# Patient Record
Sex: Female | Born: 1940 | Race: White | Hispanic: No
Health system: Southern US, Community
[De-identification: ages and names within clinical notes are randomized; demographics above are authoritative.]

## PROBLEM LIST (undated history)

## (undated) DIAGNOSIS — Z9911 Dependence on respirator [ventilator] status: Secondary | ICD-10-CM

## (undated) DIAGNOSIS — J9621 Acute and chronic respiratory failure with hypoxia: Secondary | ICD-10-CM

## (undated) DIAGNOSIS — J984 Other disorders of lung: Secondary | ICD-10-CM

## (undated) DIAGNOSIS — J189 Pneumonia, unspecified organism: Secondary | ICD-10-CM

## (undated) DIAGNOSIS — R6520 Severe sepsis without septic shock: Secondary | ICD-10-CM

## (undated) DIAGNOSIS — A419 Sepsis, unspecified organism: Secondary | ICD-10-CM

## (undated) DIAGNOSIS — J181 Lobar pneumonia, unspecified organism: Secondary | ICD-10-CM

## (undated) DIAGNOSIS — I2699 Other pulmonary embolism without acute cor pulmonale: Secondary | ICD-10-CM

---

## 2018-09-03 ENCOUNTER — Inpatient Hospital Stay
Admission: AD | Admit: 2018-09-03 | Discharge: 2018-10-18 | Disposition: A | Discharge status: Skilled Nursing Facility | Payer: Medicare Other | Source: Other Acute Inpatient Hospital | Attending: Internal Medicine | Admitting: Internal Medicine

## 2018-09-03 ENCOUNTER — Other Ambulatory Visit (HOSPITAL_COMMUNITY): Payer: Medicare Other

## 2018-09-03 DIAGNOSIS — Z9911 Dependence on respirator [ventilator] status: Secondary | ICD-10-CM

## 2018-09-03 DIAGNOSIS — J9621 Acute and chronic respiratory failure with hypoxia: Secondary | ICD-10-CM | POA: Diagnosis present

## 2018-09-03 DIAGNOSIS — Z0189 Encounter for other specified special examinations: Secondary | ICD-10-CM

## 2018-09-03 DIAGNOSIS — R1310 Dysphagia, unspecified: Secondary | ICD-10-CM

## 2018-09-03 DIAGNOSIS — I2699 Other pulmonary embolism without acute cor pulmonale: Secondary | ICD-10-CM | POA: Diagnosis present

## 2018-09-03 DIAGNOSIS — Z01818 Encounter for other preprocedural examination: Secondary | ICD-10-CM

## 2018-09-03 DIAGNOSIS — J181 Lobar pneumonia, unspecified organism: Secondary | ICD-10-CM | POA: Diagnosis present

## 2018-09-03 DIAGNOSIS — Z9289 Personal history of other medical treatment: Secondary | ICD-10-CM

## 2018-09-03 DIAGNOSIS — J189 Pneumonia, unspecified organism: Secondary | ICD-10-CM

## 2018-09-03 DIAGNOSIS — Z931 Gastrostomy status: Secondary | ICD-10-CM

## 2018-09-03 DIAGNOSIS — J984 Other disorders of lung: Secondary | ICD-10-CM

## 2018-09-03 DIAGNOSIS — Z95828 Presence of other vascular implants and grafts: Secondary | ICD-10-CM

## 2018-09-03 DIAGNOSIS — A419 Sepsis, unspecified organism: Secondary | ICD-10-CM | POA: Diagnosis present

## 2018-09-03 HISTORY — DX: Lobar pneumonia, unspecified organism: J18.1

## 2018-09-03 HISTORY — DX: Pneumonia, unspecified organism: J18.9

## 2018-09-03 HISTORY — DX: Other disorders of lung: J98.4

## 2018-09-03 HISTORY — DX: Severe sepsis without septic shock: R65.20

## 2018-09-03 HISTORY — DX: Acute and chronic respiratory failure with hypoxia: J96.21

## 2018-09-03 HISTORY — DX: Other pulmonary embolism without acute cor pulmonale: I26.99

## 2018-09-03 HISTORY — DX: Sepsis, unspecified organism: A41.9

## 2018-09-03 HISTORY — DX: Dependence on respirator (ventilator) status: Z99.11

## 2018-09-03 LAB — CBC WITH DIFFERENTIAL/PLATELET
Abs Immature Granulocytes: 0.07 10*3/uL (ref 0.00–0.07)
Basophils Absolute: 0 10*3/uL (ref 0.0–0.1)
Basophils Relative: 0 %
Eosinophils Absolute: 0 10*3/uL (ref 0.0–0.5)
Eosinophils Relative: 0 %
HCT: 28 % — ABNORMAL LOW (ref 36.0–46.0)
Hemoglobin: 9.2 g/dL — ABNORMAL LOW (ref 12.0–15.0)
Immature Granulocytes: 0 %
Lymphocytes Relative: 2 %
Lymphs Abs: 0.4 10*3/uL — ABNORMAL LOW (ref 0.7–4.0)
MCH: 29.4 pg (ref 26.0–34.0)
MCHC: 32.9 g/dL (ref 30.0–36.0)
MCV: 89.5 fL (ref 80.0–100.0)
Monocytes Absolute: 0.3 10*3/uL (ref 0.1–1.0)
Monocytes Relative: 2 %
Neutro Abs: 14.8 10*3/uL — ABNORMAL HIGH (ref 1.7–7.7)
Neutrophils Relative %: 96 %
Platelets: 291 10*3/uL (ref 150–400)
RBC: 3.13 MIL/uL — ABNORMAL LOW (ref 3.87–5.11)
RDW: 15.7 % — ABNORMAL HIGH (ref 11.5–15.5)
WBC: 15.6 10*3/uL — ABNORMAL HIGH (ref 4.0–10.5)
nRBC: 0 % (ref 0.0–0.2)

## 2018-09-03 LAB — BLOOD GAS, ARTERIAL
Acid-Base Excess: 0.1 mmol/L (ref 0.0–2.0)
Bicarbonate: 23.2 mmol/L (ref 20.0–28.0)
FIO2: 40
MECHVT: 450 mL
O2 Saturation: 99 %
PEEP: 8 cmH2O
Patient temperature: 98.2
RATE: 12 resp/min
pCO2 arterial: 30.9 mmHg — ABNORMAL LOW (ref 32.0–48.0)
pH, Arterial: 7.486 — ABNORMAL HIGH (ref 7.350–7.450)
pO2, Arterial: 173 mmHg — ABNORMAL HIGH (ref 83.0–108.0)

## 2018-09-03 LAB — COMPREHENSIVE METABOLIC PANEL
ALT: 37 U/L (ref 0–44)
AST: 26 U/L (ref 15–41)
Albumin: 2.6 g/dL — ABNORMAL LOW (ref 3.5–5.0)
Alkaline Phosphatase: 123 U/L (ref 38–126)
Anion gap: 13 (ref 5–15)
BUN: 21 mg/dL (ref 8–23)
CO2: 23 mmol/L (ref 22–32)
Calcium: 8.4 mg/dL — ABNORMAL LOW (ref 8.9–10.3)
Chloride: 99 mmol/L (ref 98–111)
Creatinine, Ser: 0.87 mg/dL (ref 0.44–1.00)
GFR calc Af Amer: >60 mL/min (ref >60)
GFR calc non Af Amer: >60 mL/min (ref >60)
Glucose, Bld: 140 mg/dL — ABNORMAL HIGH (ref 70–99)
Potassium: 4.3 mmol/L (ref 3.5–5.1)
Sodium: 135 mmol/L (ref 135–145)
Total Bilirubin: 0.9 mg/dL (ref 0.3–1.2)
Total Protein: 6.5 g/dL (ref 6.5–8.1)

## 2018-09-03 LAB — HEPARIN LEVEL (UNFRACTIONATED): Heparin Unfractionated: <0.1 IU/mL — ABNORMAL LOW (ref 0.30–0.70)

## 2018-09-03 LAB — PROTIME-INR
INR: 2.7 — ABNORMAL HIGH (ref 0.8–1.2)
Prothrombin Time: 28.6 seconds — ABNORMAL HIGH (ref 11.4–15.2)

## 2018-09-03 MED ORDER — PROSOURCE TF PO LIQD
45.00 | ORAL | Status: DC
Start: 2018-09-03 — End: 2018-09-03

## 2018-09-03 MED ORDER — GENERIC EXTERNAL MEDICATION
Status: DC
Start: ? — End: 2018-09-03

## 2018-09-03 MED ORDER — FAMOTIDINE 20 MG PO TABS
20.00 | ORAL_TABLET | ORAL | Status: DC
Start: 2018-09-03 — End: 2018-09-03

## 2018-09-03 MED ORDER — GENERIC EXTERNAL MEDICATION
1.00 | Status: DC
Start: ? — End: 2018-09-03

## 2018-09-03 MED ORDER — HEPARIN SOD (PORCINE) IN D5W 100 UNIT/ML IV SOLN
18.00 | INTRAVENOUS | Status: DC
Start: ? — End: 2018-09-03

## 2018-09-03 MED ORDER — NALOXONE HCL 0.4 MG/ML IJ SOLN
0.04 | INTRAMUSCULAR | Status: DC
Start: ? — End: 2018-09-03

## 2018-09-03 MED ORDER — CARBOXYMETHYLCELLULOSE SOD PF 0.5 % OP SOLN
2.00 | OPHTHALMIC | Status: DC
Start: 2018-09-03 — End: 2018-09-03

## 2018-09-03 MED ORDER — CLINDAMYCIN PHOSPHATE IN D5W 600 MG/50ML IV SOLN
600.00 | INTRAVENOUS | Status: DC
Start: 2018-09-03 — End: 2018-09-03

## 2018-09-03 MED ORDER — FUROSEMIDE 10 MG/ML IJ SOLN
20.00 | INTRAMUSCULAR | Status: DC
Start: ? — End: 2018-09-03

## 2018-09-03 MED ORDER — INSULIN ASPART 100 UNIT/ML FLEXPEN
0.00 | PEN_INJECTOR | SUBCUTANEOUS | Status: DC
Start: 2018-09-03 — End: 2018-09-03

## 2018-09-03 MED ORDER — SODIUM CHLORIDE 0.9 % IV SOLN
INTRAVENOUS | Status: DC
Start: ? — End: 2018-09-03

## 2018-09-03 MED ORDER — ACETAMINOPHEN 325 MG PO TABS
650.00 | ORAL_TABLET | ORAL | Status: DC
Start: ? — End: 2018-09-03

## 2018-09-03 MED ORDER — GENERIC EXTERNAL MEDICATION
5.00 | Status: DC
Start: ? — End: 2018-09-03

## 2018-09-03 MED ORDER — LACTATED RINGERS IV SOLN
500.00 | INTRAVENOUS | Status: DC
Start: ? — End: 2018-09-03

## 2018-09-03 MED ORDER — MAGNESIUM SULFATE 4 GM/100ML IV SOLN
4.00 | INTRAVENOUS | Status: DC
Start: ? — End: 2018-09-03

## 2018-09-03 MED ORDER — METOCLOPRAMIDE HCL 5 MG/ML IJ SOLN
5.00 | INTRAMUSCULAR | Status: DC
Start: 2018-09-03 — End: 2018-09-03

## 2018-09-03 MED ORDER — ATROPINE SULFATE 0.5 MG/5ML IJ SOSY
.50 | PREFILLED_SYRINGE | INTRAMUSCULAR | Status: DC
Start: ? — End: 2018-09-03

## 2018-09-03 MED ORDER — NOREPINEPHRINE 4 MG/250ML-% IV SOLN
0.02 | INTRAVENOUS | Status: DC
Start: ? — End: 2018-09-03

## 2018-09-03 MED ORDER — PREDNISONE 20 MG PO TABS
20.00 | ORAL_TABLET | ORAL | Status: DC
Start: 2018-09-04 — End: 2018-09-03

## 2018-09-03 MED ORDER — GENERIC EXTERNAL MEDICATION
4000.00 | Status: DC
Start: ? — End: 2018-09-03

## 2018-09-03 MED ORDER — BLISTEX MEDICATED EX OINT
TOPICAL_OINTMENT | CUTANEOUS | Status: DC
Start: ? — End: 2018-09-03

## 2018-09-03 MED ORDER — ONDANSETRON HCL 4 MG/2ML IJ SOLN
4.00 | INTRAMUSCULAR | Status: DC
Start: ? — End: 2018-09-03

## 2018-09-03 MED ORDER — GENERIC EXTERNAL MEDICATION
12.00 | Status: DC
Start: ? — End: 2018-09-03

## 2018-09-03 MED ORDER — CULTURELLE PO CAPS
1.00 | ORAL_CAPSULE | ORAL | Status: DC
Start: 2018-09-03 — End: 2018-09-03

## 2018-09-03 MED ORDER — INSULIN NPH (HUMAN) (ISOPHANE) 100 UNIT/ML ~~LOC~~ SUSP
25.00 | SUBCUTANEOUS | Status: DC
Start: 2018-09-03 — End: 2018-09-03

## 2018-09-03 MED ORDER — CHLORHEXIDINE GLUCONATE 0.12 % MT SOLN
15.00 | OROMUCOSAL | Status: DC
Start: 2018-09-03 — End: 2018-09-03

## 2018-09-03 MED ORDER — DEXTROSE 10 % IV SOLN
250.00 | INTRAVENOUS | Status: DC
Start: ? — End: 2018-09-03

## 2018-09-03 MED ORDER — IPRATROPIUM-ALBUTEROL 0.5-2.5 (3) MG/3ML IN SOLN
3.00 | RESPIRATORY_TRACT | Status: DC
Start: ? — End: 2018-09-03

## 2018-09-03 MED ORDER — PHENYLEPHRINE HCL-NACL 0.8-0.9 MG/10ML-% IV SOSY
160.00 | PREFILLED_SYRINGE | INTRAVENOUS | Status: DC
Start: ? — End: 2018-09-03

## 2018-09-03 MED ORDER — EPINEPHRINE PF 1 MG/10ML IJ SOSY
1.00 | PREFILLED_SYRINGE | INTRAMUSCULAR | Status: DC
Start: ? — End: 2018-09-03

## 2018-09-04 ENCOUNTER — Encounter: Payer: Self-pay | Admitting: Internal Medicine

## 2018-09-04 DIAGNOSIS — J189 Pneumonia, unspecified organism: Secondary | ICD-10-CM

## 2018-09-04 DIAGNOSIS — A419 Sepsis, unspecified organism: Secondary | ICD-10-CM | POA: Diagnosis present

## 2018-09-04 DIAGNOSIS — J9621 Acute and chronic respiratory failure with hypoxia: Secondary | ICD-10-CM | POA: Diagnosis not present

## 2018-09-04 DIAGNOSIS — I2699 Other pulmonary embolism without acute cor pulmonale: Secondary | ICD-10-CM | POA: Diagnosis not present

## 2018-09-04 DIAGNOSIS — Z9911 Dependence on respirator [ventilator] status: Secondary | ICD-10-CM

## 2018-09-04 DIAGNOSIS — J984 Other disorders of lung: Secondary | ICD-10-CM

## 2018-09-04 DIAGNOSIS — J181 Lobar pneumonia, unspecified organism: Secondary | ICD-10-CM

## 2018-09-04 DIAGNOSIS — R6520 Severe sepsis without septic shock: Secondary | ICD-10-CM

## 2018-09-04 LAB — HEPARIN LEVEL (UNFRACTIONATED)
Heparin Unfractionated: 0.24 IU/mL — ABNORMAL LOW (ref 0.30–0.70)
Heparin Unfractionated: 0.45 IU/mL (ref 0.30–0.70)
Heparin Unfractionated: 0.26 IU/mL — ABNORMAL LOW (ref 0.30–0.70)
Heparin Unfractionated: 0.26 IU/mL — ABNORMAL LOW (ref 0.30–0.70)

## 2018-09-04 MED ORDER — GENERIC EXTERNAL MEDICATION
Status: DC
Start: ? — End: 2018-09-04

## 2018-09-04 NOTE — Consult Note (Signed)
Pulmonary Critical Care Medicine [facility] [place]  PULMONARY SERVICE  Date of Service: 09/04/2018  PULMONARY CRITICAL CARE CONSULT   Monica Finley  IWO:[id number]  DOB: 04/01/1941   DOA: 09/03/2018  Referring Physician: Carron Curie, MD  HPI: Monica Finley is a 78 y.o. female seen for follow up of Acute on Chronic Respiratory Failure.  History is limited to the chart.  Patient was admitted to the hospital with altered mental status.  Patient has past medical history significant for diabetes mellitus and peripheral vascular disease with requiring multiple amputations.  Apparently came into the hospital with increasing shortness of breath and cough and respiratory distress.  Patient had altered mental status was diagnosed with pneumonia was treated with antibiotics at the nursing facility no improvement was found unresponsive and approximately an hour since her last evaluation.  She was assessed in the emergency department had bilateral PE sepsis with shock and pneumonia.  Patient also was noted to have a right upper lobe cavitary mass.  Hospital course was as follows patient was intubated on the ventilator requiring propofol fentanyl as well as levo fed for low blood pressure.  Patient was worked up for the cavitary mass with sputum AFBs which were apparently negative.  Patient eventually was changed over to [facility] on the ventilator sedation was put on hold attempted to wean however patient did not tolerate.  Subsequently consultation was made for tracheostomy and PEG tube.  It was felt that the patient would be long-term ventilator support and therefore was transferred to our facility for further management and weaning.  Review of Systems:  ROS performed and is unremarkable other than noted above.  Past Medical History:  Diagnosis Date  . Diabetes mellitus (HCC)  . PVD (peripheral vascular disease) (HCC)   Past Surgical History:  Procedure Laterality Date  . HX CATARACT  REMOVAL  . HX EYE SURGERY  growth from eye  . HX HAND SURGERY  . HX NECK SURGERY  . PR AMPUTATION FOOT TRANSMETARSAL Right 08/04/2014  AMPUTATION FOOT TRANSMETARSAL 4TH AND 5TH RAY/ HAVE WD VAC/ PULSEVAC/ ADSON PICKUPS performed by Karen Chafe, DPM at [facility] MAIN OR  . PR AMPUTATION THIGH THROUGH FEMUR ANY LEVEL Right 08/10/2014  RIGHT BELOW KNEE AMPUTATION performed by Vernelle Emerald., MD at [facility] MAIN OR  . PR AMPUTATION THIGH THROUGH FEMUR ANY LEVEL Right 08/17/2014  RIGHT BKA performed by Vernelle Emerald., MD at [facility] MAIN OR  . PR BYPASS W/VEIN FEMORAL-POPLITEAL Right 08/01/2014  ABOVE KNEE TO BELOW KNEE POPLITEAL BYPASS performed by Lanae Crumbly, MD at [facility] MAIN OR  . PR NDSC SURG W/VIDEO-ASSISTED HARVEST VEIN CABG N/A 08/01/2014  ENDOSCOPIC VEIN HARVEST performed by Lanae Crumbly, MD at [facility] MAIN OR   Family History  Problem Relation Name Age of Onset  . Diabetes Other  . Stroke Father  . Cancer Mother  stomach cancer  . Diabetes Sister  . Diabetes Brother  . Heart Disease Neg Hx   Social History   Socioeconomic History  . Marital status: Widowed  Spouse name: Not on file  . Number of children: Not on file  . Years of education: Not on file  . Highest education level: Not on file  Occupational History  . Not on file  Social Needs  . Financial resource strain: Not on file  . Food insecurity  Worry: Not on file  Inability: Not on file  . Transportation needs  Medical: Not on file  Non-medical: Not on  file  Tobacco Use  . Smoking status: Never Smoker  . Smokeless tobacco: Never Used  Substance and Sexual Activity  . Alcohol use: No  . Drug use: No    Medications: Reviewed on Rounds  Physical Exam:  Vitals: Temperature 98 pulse 66 respiratory 20 blood pressure 129/56 saturations 100%  Ventilator Settings mode ventilation assist control FiO2 30% tidal volume 441 PEEP 8  . General: Comfortable at this time . Eyes: Grossly normal lids,  irises & conjunctiva . ENT: grossly tongue is normal . Neck: no obvious mass . Cardiovascular: S1-S2 normal no gallop or rub is noted . Respiratory: Scattered rhonchi with a few coarse breath sounds . Abdomen: Soft and nontender . Skin: no rash seen on limited exam . Musculoskeletal: not rigid . Psychiatric:unable to assess . Neurologic: no seizure no involuntary movements         Labs on Admission:  Basic Metabolic Panel: Recent Labs  Lab 09/03/18 1811  NA 135  K 4.3  CL 99  CO2 23  GLUCOSE 140*  BUN 21  CREATININE 0.87  CALCIUM 8.4*    Recent Labs  Lab 09/03/18 1720  PHART 7.486*  PCO2ART 30.9*  PO2ART 173*  HCO3 23.2  O2SAT 99.0    Liver Function Tests: Recent Labs  Lab 09/03/18 1811  AST 26  ALT 37  ALKPHOS 123  BILITOT 0.9  PROT 6.5  ALBUMIN 2.6*   No results for input(s): LIPASE, AMYLASE in the last 168 hours. No results for input(s): AMMONIA in the last 168 hours.  CBC: Recent Labs  Lab 09/03/18 1811  WBC 15.6*  NEUTROABS 14.8*  HGB 9.2*  HCT 28.0*  MCV 89.5  PLT 291    Cardiac Enzymes: No results for input(s): CKTOTAL, CKMB, CKMBINDEX, TROPONINI in the last 168 hours.  BNP (last 3 results) No results for input(s): BNP in the last 8760 hours.  ProBNP (last 3 results) No results for input(s): PROBNP in the last 8760 hours.   Radiological Exams on Admission: Dg Chest Port 1 View  Result Date: 09/03/2018 CLINICAL DATA:  ETT EXAM: PORTABLE CHEST 1 VIEW COMPARISON:  None. FINDINGS: Endotracheal tube terminates 4 cm above the carina. Lungs are clear.  No pleural effusion or pneumothorax. The heart is normal in size.  Thoracic aortic atherosclerosis. Left IJ venous catheter terminates the cavoatrial junction. Enteric tube courses into the stomach. IMPRESSION: Endotracheal tube terminates 4 cm above the carina. Additional support apparatus as above. Electronically Signed   By: Charline BillsSriyesh  Krishnan M.D.   On: 09/03/2018 18:40   Dg Abd Portable  1v  Result Date: 09/03/2018 CLINICAL DATA:  OG tube placement EXAM: PORTABLE ABDOMEN - 1 VIEW COMPARISON:  None. FINDINGS: Enteric tube terminates in the distal gastric body. Nonobstructive bowel gas pattern. Mild degenerative changes of the lumbar spine. IMPRESSION: Enteric tube terminates in the distal gastric body. Electronically Signed   By: Charline BillsSriyesh  Krishnan M.D.   On: 09/03/2018 18:41    Assessment/Plan Active Problems:   Acute on chronic respiratory failure with hypoxia (HCC)   Ventilator dependent (HCC)   Cavitary pneumonia   Bilateral pulmonary embolism (HCC)   Severe sepsis (HCC)   Lobar pneumonia, unspecified organism (HCC)   1. Acute on chronic respiratory failure with hypoxia currently patient is orally intubated.  She is on a heparin drip for pulmonary embolism.  She does respond but is not able to communicate obviously pain which is a barrier.  We will assess the RSB I mechanics try to wean  if able to tolerate. 2. Cavitary pneumonia patient had necrotizing pneumonia which was diagnosed at the other facility.  AFB smears were done will be treated with supportive care follow-up with radiological scanning CT current chest x-ray shows no acute disease based on the report. 3. Pulmonary embolism patient is on anticoagulation will continue with current therapeutic levels continue with supportive care. 4. Sepsis with shock currently off of pressors hemodynamically she is stable we will continue with supportive care. 5. Lobar pneumonia unspecified continue with supportive care patient has been treated with antibiotics as noted above she did have a cavitary lesion I would suggest doing a CT scan of the chest for further assessment  I have personally seen and evaluated the patient, evaluated laboratory and imaging results, formulated the assessment and plan and placed orders.  Patient is critically ill in danger of cardiac arrest and death orally intubated with a high risk airway The Patient  requires high complexity decision making for assessment and support.  Case was discussed on Rounds with the Respiratory Therapy Staff Time Spent  Yevonne Pax, MD [facility] Pulmonary Critical Care Medicine Sleep Medicine

## 2018-09-05 DIAGNOSIS — J181 Lobar pneumonia, unspecified organism: Secondary | ICD-10-CM | POA: Diagnosis not present

## 2018-09-05 DIAGNOSIS — J9621 Acute and chronic respiratory failure with hypoxia: Secondary | ICD-10-CM | POA: Diagnosis not present

## 2018-09-05 DIAGNOSIS — J189 Pneumonia, unspecified organism: Secondary | ICD-10-CM | POA: Diagnosis not present

## 2018-09-05 DIAGNOSIS — I2699 Other pulmonary embolism without acute cor pulmonale: Secondary | ICD-10-CM | POA: Diagnosis not present

## 2018-09-05 LAB — HEPARIN LEVEL (UNFRACTIONATED)
Heparin Unfractionated: 0.23 IU/mL — ABNORMAL LOW (ref 0.30–0.70)
Heparin Unfractionated: 0.5 IU/mL (ref 0.30–0.70)
Heparin Unfractionated: 0.29 IU/mL — ABNORMAL LOW (ref 0.30–0.70)

## 2018-09-05 NOTE — Progress Notes (Addendum)
Pulmonary Critical Care Medicine [place]   PULMONARY CRITICAL CARE SERVICE  PROGRESS NOTE  Date of Service: 09/05/2018  Monica Finley  WCB:[id number]  DOB: 23-Feb-1941   DOA: 09/03/2018  Referring Physician: Merton Border, MD  HPI: Monica Finley is a 78 y.o. female seen for follow up of Acute on Chronic Respiratory Failure.  Patient currently on pressure support 12/5 30% FiO2.  Has a goal of 6 hours today and will get back on ventilator assist control mode  Medications: Reviewed on Rounds  Physical Exam:  Vitals: Pulse 66 respirations 17 BP 141/79 O2 sat 100% temp 98.1  Ventilator Settings currently on pressure support 12/5 with an FiO2 30%.  . General: Comfortable at this time . Eyes: Grossly normal lids, irises & conjunctiva . ENT: grossly tongue is normal . Neck: no obvious mass . Cardiovascular: S1 S2 normal no gallop . Respiratory: Coarse breath sounds . Abdomen: soft . Skin: no rash seen on limited exam . Musculoskeletal: not rigid . Psychiatric:unable to assess . Neurologic: no seizure no involuntary movements         Lab Data:   Basic Metabolic Panel: Recent Labs  Lab 09/03/18 1811  NA 135  K 4.3  CL 99  CO2 23  GLUCOSE 140*  BUN 21  CREATININE 0.87  CALCIUM 8.4*    ABG: Recent Labs  Lab 09/03/18 1720  PHART 7.486*  PCO2ART 30.9*  PO2ART 173*  HCO3 23.2  O2SAT 99.0    Liver Function Tests: Recent Labs  Lab 09/03/18 1811  AST 26  ALT 37  ALKPHOS 123  BILITOT 0.9  PROT 6.5  ALBUMIN 2.6*   No results for input(s): LIPASE, AMYLASE in the last 168 hours. No results for input(s): AMMONIA in the last 168 hours.  CBC: Recent Labs  Lab 09/03/18 1811  WBC 15.6*  NEUTROABS 14.8*  HGB 9.2*  HCT 28.0*  MCV 89.5  PLT 291    Cardiac Enzymes: No results for input(s): CKTOTAL, CKMB, CKMBINDEX, TROPONINI in the last 168 hours.  BNP (last 3 results) No results for input(s): BNP in the last 8760  hours.  ProBNP (last 3 results) No results for input(s): PROBNP in the last 8760 hours.  Radiological Exams: No results found.  Assessment/Plan Active Problems:   Acute on chronic respiratory failure with hypoxia (HCC)   Ventilator dependent (HCC)   Cavitary pneumonia   Bilateral pulmonary embolism (HCC)   Severe sepsis (HCC)   Lobar pneumonia, unspecified organism ([place])   1. Acute on chronic respiratory failure with hypoxia patient remains orally intubated at this time.  Currently on pressure support but will rest on ventilator AC VC rate of 12 tidal in 450 PEEP of 8 with an FiO2 of 30% once for 6 hours has been met.  Continue pulmonary toilet secretion management 2. Cavitary pneumonia patient advertising pneumonia which is diagnosed at previous facility.  Continue to follow 3. Pulmonary embolism patient is a coagulated continue current therapy 4. Sepsis with shock hemodynamically stable at this time 5. Lobar pneumonia unspecified continue with supportive care   I have personally seen and evaluated the patient, evaluated laboratory and imaging results, formulated the assessment and plan and placed orders. The Patient requires high complexity decision making for assessment and support.  Case was discussed on Rounds with the Respiratory Therapy Staff  Allyne Gee, MD [facility] Pulmonary Critical Care Medicine Sleep Medicine

## 2018-09-06 ENCOUNTER — Other Ambulatory Visit (HOSPITAL_COMMUNITY): Payer: Medicare Other

## 2018-09-06 DIAGNOSIS — J9621 Acute and chronic respiratory failure with hypoxia: Secondary | ICD-10-CM | POA: Diagnosis not present

## 2018-09-06 DIAGNOSIS — J189 Pneumonia, unspecified organism: Secondary | ICD-10-CM | POA: Diagnosis not present

## 2018-09-06 DIAGNOSIS — J181 Lobar pneumonia, unspecified organism: Secondary | ICD-10-CM | POA: Diagnosis not present

## 2018-09-06 DIAGNOSIS — I2699 Other pulmonary embolism without acute cor pulmonale: Secondary | ICD-10-CM | POA: Diagnosis not present

## 2018-09-06 LAB — HEPARIN LEVEL (UNFRACTIONATED): Heparin Unfractionated: 0.33 IU/mL (ref 0.30–0.70)

## 2018-09-06 MED ORDER — GENERIC EXTERNAL MEDICATION
Status: DC
Start: ? — End: 2018-09-06

## 2018-09-06 NOTE — Progress Notes (Addendum)
Pulmonary Critical Care Medicine [facility] [place]   PULMONARY CRITICAL CARE SERVICE  PROGRESS NOTE  Date of Service: 09/06/2018  Monica Finley  BFX:[id number]  DOB: 1941/01/02   DOA: 09/03/2018  Referring Physician: Carron Curie, MD  HPI: Monica Finley is a 78 y.o. female seen for follow up of Acute on Chronic Respiratory Failure.  Patient has an 8-hour goal today on pressure support 12/5 with an FiO2 of 28%.  Currently doing well with no distress.    Medications: Reviewed on Rounds  Physical Exam:  Vitals: Pulse 63 respirations 12 BP 126/57 O2 sat 100% temp 98.7  Ventilator Settings pressure support 12/5 FiO2 28% once 8 hours is complete she will be on ventilator mode AC VC rate of 12 tidal volume 450 PEEP of 8 FiO2 30%  . General: Comfortable at this time . Eyes: Grossly normal lids, irises & conjunctiva . ENT: grossly tongue is normal . Neck: no obvious mass . Cardiovascular: S1 S2 normal no gallop . Respiratory: Coarse breath sounds . Abdomen: soft . Skin: no rash seen on limited exam . Musculoskeletal: not rigid . Psychiatric:unable to assess . Neurologic: no seizure no involuntary movements         Lab Data:   Basic Metabolic Panel: Recent Labs  Lab 09/03/18 1811  NA 135  K 4.3  CL 99  CO2 23  GLUCOSE 140*  BUN 21  CREATININE 0.87  CALCIUM 8.4*    ABG: Recent Labs  Lab 09/03/18 1720  PHART 7.486*  PCO2ART 30.9*  PO2ART 173*  HCO3 23.2  O2SAT 99.0    Liver Function Tests: Recent Labs  Lab 09/03/18 1811  AST 26  ALT 37  ALKPHOS 123  BILITOT 0.9  PROT 6.5  ALBUMIN 2.6*   No results for input(s): LIPASE, AMYLASE in the last 168 hours. No results for input(s): AMMONIA in the last 168 hours.  CBC: Recent Labs  Lab 09/03/18 1811  WBC 15.6*  NEUTROABS 14.8*  HGB 9.2*  HCT 28.0*  MCV 89.5  PLT 291    Cardiac Enzymes: No results for input(s): CKTOTAL, CKMB, CKMBINDEX, TROPONINI in the last 168  hours.  BNP (last 3 results) No results for input(s): BNP in the last 8760 hours.  ProBNP (last 3 results) No results for input(s): PROBNP in the last 8760 hours.  Radiological Exams: Ct Abdomen Wo Contrast  Result Date: 09/06/2018 CLINICAL DATA:  Preop planning gastrostomy placement EXAM: CT ABDOMEN WITHOUT CONTRAST TECHNIQUE: Multidetector CT imaging of the abdomen was performed following the standard protocol without IV contrast. COMPARISON:  None. FINDINGS: Lower chest: No pleural or pericardial effusion. Aortic and coronary calcifications. Patchy nodular peripheral opacities in the lung bases left greater than right. Hepatobiliary: No focal liver abnormality is seen. No biliary dilatation. Gallbladder is collapsed or absent. Pancreas: Unremarkable. No pancreatic ductal dilatation or surrounding inflammatory changes. Spleen: Normal in size without focal abnormality. Adrenals/Urinary Tract: Bilateral nephrolithiasis, largest stone or cluster 5 mm in the left upper pole. No hydronephrosis. Unremarkable adrenal glands. Stomach/Bowel: Nasogastric tube loops in the stomach which is nondistended. Safe window for percutaneous gastrostomy is identified. Visualized portions of small bowel and colon are nondilated, unremarkable. Vascular/Lymphatic: Heavy aortic atheromatous calcifications without aneurysm. Retroaortic left renal vein, an anatomic variant. Other: No ascites.  No free air. Musculoskeletal: Mild wedge deformity of T12, age indeterminate. Spondylitic changes in the visualized lower thoracic and lumbar spine. No worrisome bone lesion. IMPRESSION: 1. Safe percutaneous approach for gastrostomy is identified, without complicating  factors. 2. Coronary and Aortic Atherosclerosis (ICD10-170.0). 3. Bilateral nephrolithiasis without hydronephrosis. Electronically Signed   By: Corlis Leak  Hassell M.D.   On: 09/06/2018 16:01    Assessment/Plan Active Problems:   Acute on chronic respiratory failure with hypoxia  (HCC)   Ventilator dependent (HCC)   Cavitary pneumonia   Bilateral pulmonary embolism (HCC)   Severe sepsis (HCC)   Lobar pneumonia, unspecified organism (HCC)   1. Acute on chronic respiratory failure with hypoxia patient mains orally intubated we will continue pressure support trials as tolerated per protocol.  Continue pulmonary toilet and secretion management. 2. Cavitary pneumonia patient had necrotizing pneumonia diagnosed at previous facility.  Continue to follow at this time 3. Pulmonary embolism patient is anticoagulated continue current therapy 4. Sepsis with shock hemodynamically stable at this time 5. Lobar pneumonia unspecified continue supportive care   I have personally seen and evaluated the patient, evaluated laboratory and imaging results, formulated the assessment and plan and placed orders. The Patient requires high complexity decision making for assessment and support.  Case was discussed on Rounds with the Respiratory Therapy Staff  Yevonne PaxSaadat A Sherene Plancarte, MD [facility] Pulmonary Critical Care Medicine Sleep Medicine

## 2018-09-07 ENCOUNTER — Other Ambulatory Visit (HOSPITAL_COMMUNITY): Payer: Medicare Other

## 2018-09-07 DIAGNOSIS — J181 Lobar pneumonia, unspecified organism: Secondary | ICD-10-CM | POA: Diagnosis not present

## 2018-09-07 DIAGNOSIS — J189 Pneumonia, unspecified organism: Secondary | ICD-10-CM | POA: Diagnosis not present

## 2018-09-07 DIAGNOSIS — J9621 Acute and chronic respiratory failure with hypoxia: Secondary | ICD-10-CM | POA: Diagnosis not present

## 2018-09-07 DIAGNOSIS — I2699 Other pulmonary embolism without acute cor pulmonale: Secondary | ICD-10-CM | POA: Diagnosis not present

## 2018-09-07 LAB — URINALYSIS, ROUTINE W REFLEX MICROSCOPIC
Bilirubin Urine: NEGATIVE
Glucose, UA: >=500 mg/dL — AB
Hgb urine dipstick: NEGATIVE
Ketones, ur: NEGATIVE mg/dL
Nitrite: NEGATIVE
Protein, ur: NEGATIVE mg/dL
Specific Gravity, Urine: 1.013 (ref 1.005–1.030)
WBC, UA: >50 WBC/hpf — ABNORMAL HIGH (ref 0–5)
pH: 6 (ref 5.0–8.0)

## 2018-09-07 LAB — HEPARIN LEVEL (UNFRACTIONATED)
Heparin Unfractionated: 0.42 IU/mL (ref 0.30–0.70)
Heparin Unfractionated: 0.15 IU/mL — ABNORMAL LOW (ref 0.30–0.70)
Heparin Unfractionated: 0.17 IU/mL — ABNORMAL LOW (ref 0.30–0.70)

## 2018-09-07 MED ORDER — GENERIC EXTERNAL MEDICATION
Status: DC
Start: ? — End: 2018-09-07

## 2018-09-07 NOTE — Progress Notes (Addendum)
[facility] [place]   PULMONARY CRITICAL CARE SERVICE  PROGRESS NOTE  Date of Service: 09/07/2018  Marvel PlanMargarita O Finley  ZOX:[id number]  DOB: 12/26/1940   DOA: 09/03/2018  Referring Physician: Carron CurieAli Hijazi, MD  HPI: Monica DaceMargarita O Humble is a 78 y.o. female seen for follow up of Acute on Chronic Respiratory Failure.  Patient continues on pressure support today 12/5 with an FiO2 of 28% for goal of 12 hours today.  Currently satting well with no distress.  Medications: Reviewed on Rounds  Physical Exam:  Vitals: Pulse 50 respirations 18 BP 131/73 O2 sat 100% temp 97.6  Ventilator Settings pressure support 12/5 FiO2 28%  . General: Comfortable at this time . Eyes: Grossly normal lids, irises & conjunctiva . ENT: grossly tongue is normal . Neck: no obvious mass . Cardiovascular: S1 S2 normal no gallop . Respiratory: Coarse breath sounds . Abdomen: soft . Skin: no rash seen on limited exam . Musculoskeletal: not rigid . Psychiatric:unable to assess . Neurologic: no seizure no involuntary movements         Lab Data:   Basic Metabolic Panel: Recent Labs  Lab 09/03/18 1811  NA 135  K 4.3  CL 99  CO2 23  GLUCOSE 140*  BUN 21  CREATININE 0.87  CALCIUM 8.4*    ABG: Recent Labs  Lab 09/03/18 1720  PHART 7.486*  PCO2ART 30.9*  PO2ART 173*  HCO3 23.2  O2SAT 99.0    Liver Function Tests: Recent Labs  Lab 09/03/18 1811  AST 26  ALT 37  ALKPHOS 123  BILITOT 0.9  PROT 6.5  ALBUMIN 2.6*   No results for input(s): LIPASE, AMYLASE in the last 168 hours. No results for input(s): AMMONIA in the last 168 hours.  CBC: Recent Labs  Lab 09/03/18 1811  WBC 15.6*  NEUTROABS 14.8*  HGB 9.2*  HCT 28.0*  MCV 89.5  PLT 291    Cardiac Enzymes: No results for input(s): CKTOTAL, CKMB, CKMBINDEX, TROPONINI in the last 168 hours.  BNP (last 3 results) No results for input(s): BNP in the last 8760 hours.  ProBNP (last 3  results) No results for input(s): PROBNP in the last 8760 hours.  Radiological Exams: Ct Abdomen Wo Contrast  Result Date: 09/06/2018 CLINICAL DATA:  Preop planning gastrostomy placement EXAM: CT ABDOMEN WITHOUT CONTRAST TECHNIQUE: Multidetector CT imaging of the abdomen was performed following the standard protocol without IV contrast. COMPARISON:  None. FINDINGS: Lower chest: No pleural or pericardial effusion. Aortic and coronary calcifications. Patchy nodular peripheral opacities in the lung bases left greater than right. Hepatobiliary: No focal liver abnormality is seen. No biliary dilatation. Gallbladder is collapsed or absent. Pancreas: Unremarkable. No pancreatic ductal dilatation or surrounding inflammatory changes. Spleen: Normal in size without focal abnormality. Adrenals/Urinary Tract: Bilateral nephrolithiasis, largest stone or cluster 5 mm in the left upper pole. No hydronephrosis. Unremarkable adrenal glands. Stomach/Bowel: Nasogastric tube loops in the stomach which is nondistended. Safe window for percutaneous gastrostomy is identified. Visualized portions of small bowel and colon are nondilated, unremarkable. Vascular/Lymphatic: Heavy aortic atheromatous calcifications without aneurysm. Retroaortic left renal vein, an anatomic variant. Other: No ascites.  No free air. Musculoskeletal: Mild wedge deformity of T12, age indeterminate. Spondylitic changes in the visualized lower thoracic and lumbar spine. No worrisome bone lesion. IMPRESSION: 1. Safe percutaneous approach for gastrostomy is identified, without complicating factors. 2. Coronary and Aortic Atherosclerosis (ICD10-170.0). 3. Bilateral nephrolithiasis without hydronephrosis. Electronically Signed   By: Ronald Pippins  Hassell M.D.  On: 09/06/2018 16:01    Assessment/Plan Active Problems:   Acute on chronic respiratory failure with hypoxia (HCC)   Ventilator dependent (HCC)   Cavitary pneumonia   Bilateral pulmonary embolism (HCC)    Severe sepsis (HCC)   Lobar pneumonia, unspecified organism (HCC)   1. Acute on chronic respiratory failure with hypoxia patient remains intubated and doing pressure support trials.  Has a goal of 12 hours today.  Currently doing well with no distress.  Continue secretion management and pulmonary toilet 2. Cavitary pneumonia patient had necrotizing pneumonia diagnosed at previous facility continue to follow 3. Pulmonary embolism currently anticoagulated continue current therapy 4. Sepsis with shock hemodynamically stable 5. Lobar pneumonia unspecified continue supportive care   I have personally seen and evaluated the patient, evaluated laboratory and imaging results, formulated the assessment and plan and placed orders. The Patient requires high complexity decision making for assessment and support.  Case was discussed on Rounds with the Respiratory Therapy Staff  Yevonne Pax, MD [facility] Pulmonary Critical Care Medicine Sleep Medicine

## 2018-09-08 DIAGNOSIS — I2699 Other pulmonary embolism without acute cor pulmonale: Secondary | ICD-10-CM | POA: Diagnosis not present

## 2018-09-08 DIAGNOSIS — J181 Lobar pneumonia, unspecified organism: Secondary | ICD-10-CM | POA: Diagnosis not present

## 2018-09-08 DIAGNOSIS — J9621 Acute and chronic respiratory failure with hypoxia: Secondary | ICD-10-CM | POA: Diagnosis not present

## 2018-09-08 DIAGNOSIS — J189 Pneumonia, unspecified organism: Secondary | ICD-10-CM | POA: Diagnosis not present

## 2018-09-08 LAB — BASIC METABOLIC PANEL
Anion gap: 10 (ref 5–15)
BUN: 33 mg/dL — ABNORMAL HIGH (ref 8–23)
CO2: 28 mmol/L (ref 22–32)
Calcium: 8.5 mg/dL — ABNORMAL LOW (ref 8.9–10.3)
Chloride: 99 mmol/L (ref 98–111)
Creatinine, Ser: 0.78 mg/dL (ref 0.44–1.00)
GFR calc Af Amer: >60 mL/min (ref >60)
GFR calc non Af Amer: >60 mL/min (ref >60)
Glucose, Bld: 182 mg/dL — ABNORMAL HIGH (ref 70–99)
Potassium: 4.5 mmol/L (ref 3.5–5.1)
Sodium: 137 mmol/L (ref 135–145)

## 2018-09-08 LAB — BLOOD CULTURE ID PANEL (REFLEXED)

## 2018-09-08 LAB — CBC
HCT: 23.5 % — ABNORMAL LOW (ref 36.0–46.0)
Hemoglobin: 7.5 g/dL — ABNORMAL LOW (ref 12.0–15.0)
MCH: 29.3 pg (ref 26.0–34.0)
MCHC: 31.9 g/dL (ref 30.0–36.0)
MCV: 91.8 fL (ref 80.0–100.0)
Platelets: 233 10*3/uL (ref 150–400)
RBC: 2.56 MIL/uL — ABNORMAL LOW (ref 3.87–5.11)
RDW: 15.6 % — ABNORMAL HIGH (ref 11.5–15.5)
WBC: 21.7 10*3/uL — ABNORMAL HIGH (ref 4.0–10.5)
nRBC: 0 % (ref 0.0–0.2)

## 2018-09-08 LAB — HEPARIN LEVEL (UNFRACTIONATED)
Heparin Unfractionated: 0.41 IU/mL (ref 0.30–0.70)
Heparin Unfractionated: 0.28 IU/mL — ABNORMAL LOW (ref 0.30–0.70)
Heparin Unfractionated: 0.28 IU/mL — ABNORMAL LOW (ref 0.30–0.70)

## 2018-09-08 NOTE — Progress Notes (Addendum)
Pulmonary Critical Care Medicine [facility] [place]   PULMONARY CRITICAL CARE SERVICE  PROGRESS NOTE  Date of Service: 09/08/2018  Monica Finley  KJZ:[id number]  DOB: 05/09/1940   DOA: 09/03/2018  Referring Physician: Carron Curie, MD  HPI: Monica Finley is a 78 y.o. female seen for follow up of Acute on Chronic Respiratory Failure.  Patient failed pressure support weaning x3 times today.  Each time had low volumes or apnea.  Continues to rest on the ventilator full support at this time.  Medications: Reviewed on Rounds  Physical Exam:  Vitals: Pulse 86 respirations 18 BP 164/61 O2 sat 100% temp 101.9  Ventilator Settings pressure support 12/5 with an FiO2 of 28%  . General: Comfortable at this time . Eyes: Grossly normal lids, irises & conjunctiva . ENT: grossly tongue is normal . Neck: no obvious mass . Cardiovascular: S1 S2 normal no gallop . Respiratory: Coarse breath sounds . Abdomen: soft . Skin: no rash seen on limited exam . Musculoskeletal: not rigid . Psychiatric:unable to assess . Neurologic: no seizure no involuntary movements         Lab Data:   Basic Metabolic Panel: Recent Labs  Lab 09/03/18 1811 09/08/18 0549  NA 135 137  K 4.3 4.5  CL 99 99  CO2 23 28  GLUCOSE 140* 182*  BUN 21 33*  CREATININE 0.87 0.78  CALCIUM 8.4* 8.5*    ABG: Recent Labs  Lab 09/03/18 1720  PHART 7.486*  PCO2ART 30.9*  PO2ART 173*  HCO3 23.2  O2SAT 99.0    Liver Function Tests: Recent Labs  Lab 09/03/18 1811  AST 26  ALT 37  ALKPHOS 123  BILITOT 0.9  PROT 6.5  ALBUMIN 2.6*   No results for input(s): LIPASE, AMYLASE in the last 168 hours. No results for input(s): AMMONIA in the last 168 hours.  CBC: Recent Labs  Lab 09/03/18 1811 09/08/18 0549  WBC 15.6* 21.7*  NEUTROABS 14.8*  --   HGB 9.2* 7.5*  HCT 28.0* 23.5*  MCV 89.5 91.8  PLT 291 233    Cardiac Enzymes: No results for input(s): CKTOTAL, CKMB, CKMBINDEX,  TROPONINI in the last 168 hours.  BNP (last 3 results) No results for input(s): BNP in the last 8760 hours.  ProBNP (last 3 results) No results for input(s): PROBNP in the last 8760 hours.  Radiological Exams: Dg Chest Port 1 View  Result Date: 09/07/2018 CLINICAL DATA:  Intubated patient admitted with pneumonia. EXAM: PORTABLE CHEST 1 VIEW COMPARISON:  Single-view of the chest 09/03/2018. FINDINGS: Endotracheal tube is in good position with the tip just below the clavicular heads. Left IJ approach central venous catheter tip is at the superior cavoatrial junction, unchanged. NG tube tip is just below the inferior margin of film within the stomach. The lungs are clear. No pneumothorax or pleural effusion. Heart size is normal. Aortic atherosclerosis noted. No acute bony abnormality. IMPRESSION: Support tubes and lines projecting good position. Lungs are clear. Electronically Signed   By: Drusilla Kanner M.D.   On: 09/07/2018 17:03    Assessment/Plan Active Problems:   Acute on chronic respiratory failure with hypoxia (HCC)   Ventilator dependent (HCC)   Cavitary pneumonia   Bilateral pulmonary embolism (HCC)   Severe sepsis (HCC)   Lobar pneumonia, unspecified organism (HCC)   1. Acute on chronic respiratory failure with hypoxia patient remains intubated and will continue with pressure support trials as patient can tolerate.  Patient is febrile today and is possibly dealing  with infection or early sepsis.  Continue pulmonary toilet and secretion management 2. Cavitary pneumonia patient had necrotizing pneumonia diagnosed at previous facility continue to follow 3. Pulmonary embolism currently anticoagulated continue current therapy 4. Sepsis with shock hemodynamically stable 5. Lobar pneumonia unspecified continue supportive care   I have personally seen and evaluated the patient, evaluated laboratory and imaging results, formulated the assessment and plan and placed orders. The  Patient requires high complexity decision making for assessment and support.  Case was discussed on Rounds with the Respiratory Therapy Staff  Yevonne PaxSaadat A , MD [facility] Pulmonary Critical Care Medicine Sleep Medicine

## 2018-09-09 DIAGNOSIS — I2699 Other pulmonary embolism without acute cor pulmonale: Secondary | ICD-10-CM | POA: Diagnosis not present

## 2018-09-09 DIAGNOSIS — J9621 Acute and chronic respiratory failure with hypoxia: Secondary | ICD-10-CM | POA: Diagnosis not present

## 2018-09-09 DIAGNOSIS — J181 Lobar pneumonia, unspecified organism: Secondary | ICD-10-CM | POA: Diagnosis not present

## 2018-09-09 DIAGNOSIS — J189 Pneumonia, unspecified organism: Secondary | ICD-10-CM | POA: Diagnosis not present

## 2018-09-09 LAB — CBC
HCT: 23.6 % — ABNORMAL LOW (ref 36.0–46.0)
Hemoglobin: 7.6 g/dL — ABNORMAL LOW (ref 12.0–15.0)
MCH: 29.1 pg (ref 26.0–34.0)
MCHC: 32.2 g/dL (ref 30.0–36.0)
MCV: 90.4 fL (ref 80.0–100.0)
Platelets: 247 10*3/uL (ref 150–400)
RBC: 2.61 MIL/uL — ABNORMAL LOW (ref 3.87–5.11)
RDW: 15.3 % (ref 11.5–15.5)
WBC: 21.9 10*3/uL — ABNORMAL HIGH (ref 4.0–10.5)
nRBC: 0 % (ref 0.0–0.2)

## 2018-09-09 LAB — URINE CULTURE: Culture: >=100000 — AB

## 2018-09-09 LAB — HEPARIN LEVEL (UNFRACTIONATED): Heparin Unfractionated: 0.19 IU/mL — ABNORMAL LOW (ref 0.30–0.70)

## 2018-09-09 LAB — ABO/RH: ABO/RH(D): O POS

## 2018-09-09 NOTE — Progress Notes (Signed)
Patient ID: Monica Finley, female   DOB: [birth year], 78 y.o.   MRN: [id number]   Percutaneous gastric tube placement request  Tmax 101.9 last pm Afeb this am  No thinners per RN  Anatomy approved for procedure by Dr Lowella Dandy  I have spoken to RN Will recheck pt early next week for poss G tube next week or so

## 2018-09-09 NOTE — Progress Notes (Signed)
Pulmonary Critical Care Medicine [facility] [place]   PULMONARY CRITICAL CARE SERVICE  PROGRESS NOTE  Date of Service: 09/09/2018  Annisa Arnn Steffek  TDS:[id number]  DOB: 02-26-1941   DOA: 09/03/2018  Referring Physician: Carron Curie, MD  HPI: KALON DIMOCK is a 78 y.o. female seen for follow up of Acute on Chronic Respiratory Failure.  Patient currently is weaning on pressure support the tracheostomy will be scheduled for next week I think once this is done we should be able to wean  Medications: Reviewed on Rounds  Physical Exam:  Vitals: Temperature 99.8 pulse 81 respiratory rate 18 blood pressure 155/57 saturations 100%  Ventilator Settings mode ventilation pressure support FiO2 is 28% tidal volume 357 pressure support 12 PEEP 5  . General: Comfortable at this time . Eyes: Grossly normal lids, irises & conjunctiva . ENT: grossly tongue is normal . Neck: no obvious mass . Cardiovascular: S1 S2 normal no gallop . Respiratory: Coarse breath sounds with a few scattered rhonchi are noted . Abdomen: soft . Skin: no rash seen on limited exam . Musculoskeletal: not rigid . Psychiatric:unable to assess . Neurologic: no seizure no involuntary movements         Lab Data:   Basic Metabolic Panel: Recent Labs  Lab 09/03/18 1811 09/08/18 0549  NA 135 137  K 4.3 4.5  CL 99 99  CO2 23 28  GLUCOSE 140* 182*  BUN 21 33*  CREATININE 0.87 0.78  CALCIUM 8.4* 8.5*    ABG: Recent Labs  Lab 09/03/18 1720  PHART 7.486*  PCO2ART 30.9*  PO2ART 173*  HCO3 23.2  O2SAT 99.0    Liver Function Tests: Recent Labs  Lab 09/03/18 1811  AST 26  ALT 37  ALKPHOS 123  BILITOT 0.9  PROT 6.5  ALBUMIN 2.6*   No results for input(s): LIPASE, AMYLASE in the last 168 hours. No results for input(s): AMMONIA in the last 168 hours.  CBC: Recent Labs  Lab 09/03/18 1811 09/08/18 0549 09/09/18 0649  WBC 15.6* 21.7* 21.9*  NEUTROABS 14.8*  --   --   HGB 9.2*  7.5* 7.6*  HCT 28.0* 23.5* 23.6*  MCV 89.5 91.8 90.4  PLT 291 233 247    Cardiac Enzymes: No results for input(s): CKTOTAL, CKMB, CKMBINDEX, TROPONINI in the last 168 hours.  BNP (last 3 results) No results for input(s): BNP in the last 8760 hours.  ProBNP (last 3 results) No results for input(s): PROBNP in the last 8760 hours.  Radiological Exams: Dg Chest Port 1 View  Result Date: 09/07/2018 CLINICAL DATA:  Intubated patient admitted with pneumonia. EXAM: PORTABLE CHEST 1 VIEW COMPARISON:  Single-view of the chest 09/03/2018. FINDINGS: Endotracheal tube is in good position with the tip just below the clavicular heads. Left IJ approach central venous catheter tip is at the superior cavoatrial junction, unchanged. NG tube tip is just below the inferior margin of film within the stomach. The lungs are clear. No pneumothorax or pleural effusion. Heart size is normal. Aortic atherosclerosis noted. No acute bony abnormality. IMPRESSION: Support tubes and lines projecting good position. Lungs are clear. Electronically Signed   By: Drusilla Kanner M.D.   On: 09/07/2018 17:03    Assessment/Plan Active Problems:   Acute on chronic respiratory failure with hypoxia (HCC)   Ventilator dependent (HCC)   Cavitary pneumonia   Bilateral pulmonary embolism (HCC)   Severe sepsis (HCC)   Lobar pneumonia, unspecified organism (HCC)   1. Acute on chronic respiratory failure with  hypoxia we will continue with full support on pressure support at this time.  Titrate oxygen down as tolerated continue with secretion management supportive care 2. Ventilator dependent we will continue with supportive care on the ventilator at this time 3. Cavitary pneumonia treated we will continue to follow 4. Severe sepsis hemodynamically stable we will follow 5. Bilateral pulmonary emboli has been stable we will continue with supportive care 6. Lobar pneumonia treated   I have personally seen and evaluated the  patient, evaluated laboratory and imaging results, formulated the assessment and plan and placed orders. The Patient requires high complexity decision making for assessment and support.  Case was discussed on Rounds with the Respiratory Therapy Staff  Yevonne PaxSaadat A Khan, MD [facility]P Pulmonary Critical Care Medicine Sleep Medicine

## 2018-09-10 DIAGNOSIS — J181 Lobar pneumonia, unspecified organism: Secondary | ICD-10-CM | POA: Diagnosis not present

## 2018-09-10 DIAGNOSIS — I2699 Other pulmonary embolism without acute cor pulmonale: Secondary | ICD-10-CM | POA: Diagnosis not present

## 2018-09-10 DIAGNOSIS — J9621 Acute and chronic respiratory failure with hypoxia: Secondary | ICD-10-CM | POA: Diagnosis not present

## 2018-09-10 DIAGNOSIS — J189 Pneumonia, unspecified organism: Secondary | ICD-10-CM | POA: Diagnosis not present

## 2018-09-10 LAB — CULTURE, BLOOD (ROUTINE X 2)
Special Requests: ADEQUATE
Special Requests: ADEQUATE

## 2018-09-10 NOTE — Progress Notes (Addendum)
Pulmonary Critical Care Medicine [facility] [place]   PULMONARY CRITICAL CARE SERVICE  PROGRESS NOTE  Date of Service: 09/10/2018  Monica Finley  [id number]:[id number]  DOB: 1940/12/01   DOA: 09/03/2018  Referring Physician: Carron Curie, MD  HPI: Monica Finley is a 78 y.o. female seen for follow up of Acute on Chronic Respiratory Failure.  Patient has now completed 16 hours on pressure support will continue with pressure support 12/5 FiO2 28% as tolerated.  Remains intubated orally.  Continue to wean as tolerated.  Medications: Reviewed on Rounds  Physical Exam:  Vitals: Pulse 70 rations 12 BP 130/61 O2 sat 100% temp 97.4  Ventilator Settings pressure support 12/5 FiO2 28%  . General: Comfortable at this time . Eyes: Grossly normal lids, irises & conjunctiva . ENT: grossly tongue is normal . Neck: no obvious mass . Cardiovascular: S1 S2 normal no gallop . Respiratory: Coarse breath sounds . Abdomen: soft . Skin: no rash seen on limited exam . Musculoskeletal: not rigid . Psychiatric:unable to assess . Neurologic: no seizure no involuntary movements         Lab Data:   Basic Metabolic Panel: Recent Labs  Lab 09/03/18 1811 09/08/18 0549  NA 135 137  K 4.3 4.5  CL 99 99  CO2 23 28  GLUCOSE 140* 182*  BUN 21 33*  CREATININE 0.87 0.78  CALCIUM 8.4* 8.5*    ABG: Recent Labs  Lab 09/03/18 1720  PHART 7.486*  PCO2ART 30.9*  PO2ART 173*  HCO3 23.2  O2SAT 99.0    Liver Function Tests: Recent Labs  Lab 09/03/18 1811  AST 26  ALT 37  ALKPHOS 123  BILITOT 0.9  PROT 6.5  ALBUMIN 2.6*   No results for input(s): LIPASE, AMYLASE in the last 168 hours. No results for input(s): AMMONIA in the last 168 hours.  CBC: Recent Labs  Lab 09/03/18 1811 09/08/18 0549 09/09/18 0649  WBC 15.6* 21.7* 21.9*  NEUTROABS 14.8*  --   --   HGB 9.2* 7.5* 7.6*  HCT 28.0* 23.5* 23.6*  MCV 89.5 91.8 90.4  PLT 291 233 247    Cardiac Enzymes: No  results for input(s): CKTOTAL, CKMB, CKMBINDEX, TROPONINI in the last 168 hours.  BNP (last 3 results) No results for input(s): BNP in the last 8760 hours.  ProBNP (last 3 results) No results for input(s): PROBNP in the last 8760 hours.  Radiological Exams: No results found.  Assessment/Plan Active Problems:   Acute on chronic respiratory failure with hypoxia (HCC)   Ventilator dependent (HCC)   Cavitary pneumonia   Bilateral pulmonary embolism (HCC)   Severe sepsis (HCC)   Lobar pneumonia, unspecified organism (HCC)   1. Acute on chronic respiratory failure with hypoxia continue with full support on pressure support mode at this time.  Continue to titrate oxygen as tolerated protocol.  Continue pulmonary toilet and secretion management 2. Ventilator management continue supportive care 3. Cavitary pneumonia treated negative follow 4. Severe sepsis hemodynamically stable 5. Bilateral pulmonary emboli has been stable continue supportive measures 6. Lobar pneumonia treated   I have personally seen and evaluated the patient, evaluated laboratory and imaging results, formulated the assessment and plan and placed orders. The Patient requires high complexity decision making for assessment and support.  Case was discussed on Rounds with the Respiratory Therapy Staff  Yevonne Pax, MD [facility] Pulmonary Critical Care Medicine Sleep Medicine

## 2018-09-11 DIAGNOSIS — J181 Lobar pneumonia, unspecified organism: Secondary | ICD-10-CM | POA: Diagnosis not present

## 2018-09-11 DIAGNOSIS — J9621 Acute and chronic respiratory failure with hypoxia: Secondary | ICD-10-CM | POA: Diagnosis not present

## 2018-09-11 DIAGNOSIS — I2699 Other pulmonary embolism without acute cor pulmonale: Secondary | ICD-10-CM | POA: Diagnosis not present

## 2018-09-11 DIAGNOSIS — J189 Pneumonia, unspecified organism: Secondary | ICD-10-CM | POA: Diagnosis not present

## 2018-09-11 LAB — VANCOMYCIN, TROUGH: Vancomycin Tr: 9 ug/mL — ABNORMAL LOW (ref 15–20)

## 2018-09-11 NOTE — Progress Notes (Addendum)
Pulmonary Critical Care Medicine [facility] [place]   PULMONARY CRITICAL CARE SERVICE  PROGRESS NOTE  Date of Service: 09/11/2018  Monica Finley  [id number]  DOB: 16-Jul-1940   DOA: 09/03/2018  Referring Physician: Carron Curie, MD  HPI: Monica Finley is a 78 y.o. female seen for follow up of Acute on Chronic Respiratory Failure.  Patient is doing well has been on pressure support for 24 hours.  No further weaning at this time plan is for tracheostomy on Monday.  Medications: Reviewed on Rounds  Physical Exam:  Vitals: Pulse 110 respirations 20 BP 152/67 O2 sat 100% temp 90.5  Ventilator Settings pressure support 12/5 FiO2 28%  . General: Comfortable at this time . Eyes: Grossly normal lids, irises & conjunctiva . ENT: grossly tongue is normal . Neck: no obvious mass . Cardiovascular: S1 S2 normal no gallop . Respiratory: Coarse breath sounds . Abdomen: soft . Skin: no rash seen on limited exam . Musculoskeletal: not rigid . Psychiatric:unable to assess . Neurologic: no seizure no involuntary movements         Lab Data:   Basic Metabolic Panel: Recent Labs  Lab 09/08/18 0549  NA 137  K 4.5  CL 99  CO2 28  GLUCOSE 182*  BUN 33*  CREATININE 0.78  CALCIUM 8.5*    ABG: No results for input(s): PHART, PCO2ART, PO2ART, HCO3, O2SAT in the last 168 hours.  Liver Function Tests: No results for input(s): AST, ALT, ALKPHOS, BILITOT, PROT, ALBUMIN in the last 168 hours. No results for input(s): LIPASE, AMYLASE in the last 168 hours. No results for input(s): AMMONIA in the last 168 hours.  CBC: Recent Labs  Lab 09/08/18 0549 09/09/18 0649  WBC 21.7* 21.9*  HGB 7.5* 7.6*  HCT 23.5* 23.6*  MCV 91.8 90.4  PLT 233 247    Cardiac Enzymes: No results for input(s): CKTOTAL, CKMB, CKMBINDEX, TROPONINI in the last 168 hours.  BNP (last 3 results) No results for input(s): BNP in the last 8760 hours.  ProBNP (last 3 results) No results  for input(s): PROBNP in the last 8760 hours.  Radiological Exams: No results found.  Assessment/Plan Active Problems:   Acute on chronic respiratory failure with hypoxia (HCC)   Ventilator dependent (HCC)   Cavitary pneumonia   Bilateral pulmonary embolism (HCC)   Severe sepsis (HCC)   Lobar pneumonia, unspecified organism (HCC)   1. Acute on chronic respiratory failure with hypoxia continue full support on pressure support mode.  Plan is for tracheostomy on Monday.  Continue pulmonary toilet secretion management 2. Ventilator management continue supportive care 3. Cavitary pneumonia treated continue to follow 4. Severe sepsis hemodynamically stable 5. Bilateral pulmonary emboli stable at this time continue supportive measures 6. Lobar pneumonia treated   I have personally seen and evaluated the patient, evaluated laboratory and imaging results, formulated the assessment and plan and placed orders. The Patient requires high complexity decision making for assessment and support.  Case was discussed on Rounds with the Respiratory Therapy Staff  Yevonne Pax, MD [facility] Pulmonary Critical Care Medicine Sleep Medicine

## 2018-09-12 DIAGNOSIS — J181 Lobar pneumonia, unspecified organism: Secondary | ICD-10-CM | POA: Diagnosis not present

## 2018-09-12 DIAGNOSIS — I2699 Other pulmonary embolism without acute cor pulmonale: Secondary | ICD-10-CM | POA: Diagnosis not present

## 2018-09-12 DIAGNOSIS — J9621 Acute and chronic respiratory failure with hypoxia: Secondary | ICD-10-CM | POA: Diagnosis not present

## 2018-09-12 DIAGNOSIS — J189 Pneumonia, unspecified organism: Secondary | ICD-10-CM | POA: Diagnosis not present

## 2018-09-12 LAB — BASIC METABOLIC PANEL
Anion gap: 14 (ref 5–15)
BUN: 22 mg/dL (ref 8–23)
CO2: 25 mmol/L (ref 22–32)
Calcium: 8.4 mg/dL — ABNORMAL LOW (ref 8.9–10.3)
Chloride: 92 mmol/L — ABNORMAL LOW (ref 98–111)
Creatinine, Ser: 0.71 mg/dL (ref 0.44–1.00)
GFR calc Af Amer: >60 mL/min (ref >60)
GFR calc non Af Amer: >60 mL/min (ref >60)
Glucose, Bld: 186 mg/dL — ABNORMAL HIGH (ref 70–99)
Potassium: 3.9 mmol/L (ref 3.5–5.1)
Sodium: 131 mmol/L — ABNORMAL LOW (ref 135–145)

## 2018-09-12 LAB — CBC
HCT: 21.1 % — ABNORMAL LOW (ref 36.0–46.0)
Hemoglobin: 6.8 g/dL — CL (ref 12.0–15.0)
MCH: 28.7 pg (ref 26.0–34.0)
MCHC: 32.2 g/dL (ref 30.0–36.0)
MCV: 89 fL (ref 80.0–100.0)
Platelets: 268 10*3/uL (ref 150–400)
RBC: 2.37 MIL/uL — ABNORMAL LOW (ref 3.87–5.11)
RDW: 15.2 % (ref 11.5–15.5)
WBC: 18.3 10*3/uL — ABNORMAL HIGH (ref 4.0–10.5)
nRBC: 0 % (ref 0.0–0.2)

## 2018-09-12 LAB — PREPARE RBC (CROSSMATCH)

## 2018-09-12 NOTE — Progress Notes (Addendum)
Pulmonary Critical Care Medicine [facility] [place]   PULMONARY CRITICAL CARE SERVICE  PROGRESS NOTE  Date of Service: 09/12/2018  Monica Finley  KPV:[id number]  DOB: August 08, 1940   DOA: 09/03/2018  Referring Physician: Carron Curie, MD  HPI: Monica Finley is a 78 y.o. female seen for follow up of Acute on Chronic Respiratory Failure.  Patient continues to do well on pressure support 12/5 with an FiO2 of 28%.  Satting well with no distress.  Patient still planning for tracheostomy collar.  Medications: Reviewed on Rounds  Physical Exam:  Vitals: Pulse 87 respirations 20 BP 86/55 O2 sat 100% 97.9  Ventilator Settings pressure support 12/5 FiO2 28%  . General: Comfortable at this time . Eyes: Grossly normal lids, irises & conjunctiva . ENT: grossly tongue is normal . Neck: no obvious mass . Cardiovascular: S1 S2 normal no gallop . Respiratory: Coarse breath sounds . Abdomen: soft . Skin: no rash seen on limited exam . Musculoskeletal: not rigid . Psychiatric:unable to assess . Neurologic: no seizure no involuntary movements         Lab Data:   Basic Metabolic Panel: Recent Labs  Lab 09/08/18 0549 09/12/18 0645  NA 137 131*  K 4.5 3.9  CL 99 92*  CO2 28 25  GLUCOSE 182* 186*  BUN 33* 22  CREATININE 0.78 0.71  CALCIUM 8.5* 8.4*    ABG: No results for input(s): PHART, PCO2ART, PO2ART, HCO3, O2SAT in the last 168 hours.  Liver Function Tests: No results for input(s): AST, ALT, ALKPHOS, BILITOT, PROT, ALBUMIN in the last 168 hours. No results for input(s): LIPASE, AMYLASE in the last 168 hours. No results for input(s): AMMONIA in the last 168 hours.  CBC: Recent Labs  Lab 09/08/18 0549 09/09/18 0649 09/12/18 0645  WBC 21.7* 21.9* 18.3*  HGB 7.5* 7.6* 6.8*  HCT 23.5* 23.6* 21.1*  MCV 91.8 90.4 89.0  PLT 233 247 268    Cardiac Enzymes: No results for input(s): CKTOTAL, CKMB, CKMBINDEX, TROPONINI in the last 168 hours.  BNP  (last 3 results) No results for input(s): BNP in the last 8760 hours.  ProBNP (last 3 results) No results for input(s): PROBNP in the last 8760 hours.  Radiological Exams: No results found.  Assessment/Plan Active Problems:   Acute on chronic respiratory failure with hypoxia (HCC)   Ventilator dependent (HCC)   Cavitary pneumonia   Bilateral pulmonary embolism (HCC)   Severe sepsis (HCC)   Lobar pneumonia, unspecified organism (HCC)   1. Acute on chronic respiratory failure with hypoxia continue full support on pressure support mode at this time.  Plan for tracheostomy tomorrow.  Continue pulmonary toilet secretion management 2. Ventilator management continue supportive care 3. Cavitary pneumonia treated continue to follow 4. Severe sepsis hemodynamically stable 5. Bilateral pulmonary emboli stable at this time continue supportive measures 6. Lobar pneumonia treated   I have personally seen and evaluated the patient, evaluated laboratory and imaging results, formulated the assessment and plan and placed orders. The Patient requires high complexity decision making for assessment and support.  Case was discussed on Rounds with the Respiratory Therapy Staff  Yevonne Pax, MD [facility] Pulmonary Critical Care Medicine Sleep Medicine

## 2018-09-13 ENCOUNTER — Other Ambulatory Visit (HOSPITAL_COMMUNITY): Payer: Medicare Other

## 2018-09-13 ENCOUNTER — Ambulatory Visit: Payer: Self-pay | Admitting: Otolaryngology

## 2018-09-13 ENCOUNTER — Encounter: Payer: Self-pay | Admitting: Radiology

## 2018-09-13 DIAGNOSIS — J9621 Acute and chronic respiratory failure with hypoxia: Secondary | ICD-10-CM | POA: Diagnosis not present

## 2018-09-13 DIAGNOSIS — I2699 Other pulmonary embolism without acute cor pulmonale: Secondary | ICD-10-CM | POA: Diagnosis not present

## 2018-09-13 DIAGNOSIS — J189 Pneumonia, unspecified organism: Secondary | ICD-10-CM | POA: Diagnosis not present

## 2018-09-13 DIAGNOSIS — J181 Lobar pneumonia, unspecified organism: Secondary | ICD-10-CM | POA: Diagnosis not present

## 2018-09-13 LAB — TYPE AND SCREEN
ABO/RH(D): O POS
Antibody Screen: NEGATIVE
Unit division: 0

## 2018-09-13 LAB — BPAM RBC
Blood Product Expiration Date: 202006142359
ISSUE DATE / TIME: 202005171407
Unit Type and Rh: 5100

## 2018-09-13 LAB — CBC
HCT: 29.1 % — ABNORMAL LOW (ref 36.0–46.0)
Hemoglobin: 9.6 g/dL — ABNORMAL LOW (ref 12.0–15.0)
MCH: 29.4 pg (ref 26.0–34.0)
MCHC: 33 g/dL (ref 30.0–36.0)
MCV: 89 fL (ref 80.0–100.0)
Platelets: 273 10*3/uL (ref 150–400)
RBC: 3.27 MIL/uL — ABNORMAL LOW (ref 3.87–5.11)
RDW: 14.9 % (ref 11.5–15.5)
WBC: 15.5 10*3/uL — ABNORMAL HIGH (ref 4.0–10.5)
nRBC: 0 % (ref 0.0–0.2)

## 2018-09-13 LAB — SARS CORONAVIRUS 2 BY RT PCR (HOSPITAL ORDER, PERFORMED IN ~~LOC~~ HOSPITAL LAB): SARS Coronavirus 2: NEGATIVE

## 2018-09-13 MED ORDER — IOHEXOL 300 MG/ML  SOLN
75.0000 mL | Freq: Once | INTRAMUSCULAR | Status: AC | PRN
  Administered 2018-09-13: 75 mL via INTRAVENOUS

## 2018-09-13 NOTE — Anesthesia Preprocedure Evaluation (Addendum)
Anesthesia Evaluation  Patient identified by MRN, date of birth, ID band  Reviewed: Allergy & Precautions, NPO status , Patient's Chart, lab work & pertinent test results  History of Anesthesia Complications Negative for: history of anesthetic complications  Airway Mallampati: Intubated       Dental   Pulmonary pneumonia, PE   Pulmonary exam normal        Cardiovascular + Peripheral Vascular Disease and + DVT  Normal cardiovascular exam     Neuro/Psych negative neurological ROS  negative psych ROS   GI/Hepatic negative GI ROS, Neg liver ROS,   Endo/Other  diabetes  Renal/GU negative Renal ROS  negative genitourinary   Musculoskeletal negative musculoskeletal ROS (+)   Abdominal   Peds  Hematology negative hematology ROS (+)   Anesthesia Other Findings   Reproductive/Obstetrics                            Anesthesia Physical Anesthesia Plan  ASA: III  Anesthesia Plan: General   Post-op Pain Management:    Induction: Intravenous and Inhalational  PONV Risk Score and Plan: 3 and Ondansetron, Dexamethasone and Treatment may vary due to age or medical condition  Airway Management Planned: Oral ETT and Tracheostomy  Additional Equipment: None  Intra-op Plan:   Post-operative Plan: Post-operative intubation/ventilation  Informed Consent: I have reviewed the patients History and Physical, chart, labs and discussed the procedure including the risks, benefits and alternatives for the proposed anesthesia with the patient or authorized representative who has indicated his/her understanding and acceptance.       Plan Discussed with:   Anesthesia Plan Comments:        Anesthesia Quick Evaluation

## 2018-09-13 NOTE — Progress Notes (Addendum)
Pulmonary Critical Care Medicine [facility] [place]   PULMONARY CRITICAL CARE SERVICE  PROGRESS NOTE  Date of Service: 09/13/2018  Monica Finley  SWF:[id number]  DOB: 10-10-1940   DOA: 09/03/2018  Referring Physician: Carron Curie, MD  HPI: Monica Finley is a 78 y.o. female seen for follow up of Acute on Chronic Respiratory Failure.  Patient remains on pressure support 12/5 with FiO2 28% and has been for the last 3 days.  Overall doing well with good saturations and no acute distress.  Medications: Reviewed on Rounds  Physical Exam:  Vitals: Pulse 99 respirations 18 BP 173/23 O2 sat 100% temp 98.7  Ventilator Settings pressure support 12 5 with FiO2 28%  . General: Comfortable at this time . Eyes: Grossly normal lids, irises & conjunctiva . ENT: grossly tongue is normal . Neck: no obvious mass . Cardiovascular: S1 S2 normal no gallop . Respiratory: Coarse breath sounds . Abdomen: soft . Skin: no rash seen on limited exam . Musculoskeletal: not rigid . Psychiatric:unable to assess . Neurologic: no seizure no involuntary movements         Lab Data:   Basic Metabolic Panel: Recent Labs  Lab 09/08/18 0549 09/12/18 0645  NA 137 131*  K 4.5 3.9  CL 99 92*  CO2 28 25  GLUCOSE 182* 186*  BUN 33* 22  CREATININE 0.78 0.71  CALCIUM 8.5* 8.4*    ABG: No results for input(s): PHART, PCO2ART, PO2ART, HCO3, O2SAT in the last 168 hours.  Liver Function Tests: No results for input(s): AST, ALT, ALKPHOS, BILITOT, PROT, ALBUMIN in the last 168 hours. No results for input(s): LIPASE, AMYLASE in the last 168 hours. No results for input(s): AMMONIA in the last 168 hours.  CBC: Recent Labs  Lab 09/08/18 0549 09/09/18 0649 09/12/18 0645 09/13/18 0541  WBC 21.7* 21.9* 18.3* 15.5*  HGB 7.5* 7.6* 6.8* 9.6*  HCT 23.5* 23.6* 21.1* 29.1*  MCV 91.8 90.4 89.0 89.0  PLT 233 247 268 273    Cardiac Enzymes: No results for input(s): CKTOTAL, CKMB,  CKMBINDEX, TROPONINI in the last 168 hours.  BNP (last 3 results) No results for input(s): BNP in the last 8760 hours.  ProBNP (last 3 results) No results for input(s): PROBNP in the last 8760 hours.  Radiological Exams: Ct Chest W Contrast  Result Date: 09/13/2018 CLINICAL DATA:  Follow-up pulmonary nodule EXAM: CT CHEST WITHOUT CONTRAST TECHNIQUE: Multidetector CT imaging of the chest was performed following the standard protocol without IV contrast. COMPARISON:  Outside chest CT report 08/20/2018 FINDINGS: Contrast was requested and attempted, with extravasation of approximately 35 cc of contrast into the ventral forearm. There is some swelling without skin changes upon my inspection. The patient is intubated and sensory exam is limited. There is bruising at the antecubital fossa and in the dorsal hand which was present previously based on coloration and per report. The remaining IV is in the hand and is tenuous per report. Cardiovascular: Normal heart size. No pericardial effusion. Coronary and aortic atherosclerosis. Mediastinum/Nodes: Negative for adenopathy. The endotracheal tube tip is above the carina and the orogastric tube reaches the stomach at least. Lungs/Pleura: There is patchy clustered ground-glass and nodular opacity primarily in the left lung. Irregularly-shaped pulmonary nodule in the right upper lobe measuring up to 13 mm. This matches the size described on prior study but cavitary features are not seen in distinction with prior. No effusion or pneumothorax. Upper Abdomen: No acute finding. Musculoskeletal: Spinal degeneration. Bilateral glenohumeral arthroplasty. IMPRESSION:  1. 13 mm irregular pulmonary nodule in the right upper lobe, size stable from CT report last month. If appropriate for comorbidities, consider one of the following in 3 months: (A) repeat chest CT, (b) follow-up PET-CT, or (c) tissue sampling. This recommendation follows the consensus statement: Guidelines for  Management of Incidental Pulmonary Nodules Detected on CT Images: From the Fleischner Society 2017; Radiology 2017; 284:228-243. 2. Mild inflammatory appearing lung opacity elsewhere, which correlates with history of pneumonia on prior CT. 3. Noncontrast study was performed due to limited vascular access. 35 cc was infiltrated in the left ventral forearm. Electronically Signed   By: Marnee SpringJonathon  Watts M.D.   On: 09/13/2018 12:02    Assessment/Plan Active Problems:   Acute on chronic respiratory failure with hypoxia (HCC)   Ventilator dependent (HCC)   Cavitary pneumonia   Bilateral pulmonary embolism (HCC)   Severe sepsis (HCC)   Lobar pneumonia, unspecified organism (HCC)   1. Acute chronic respiratory failure with hypoxia continue with pressure support mode at this time.  Continue pulmonary toilet and secretion management 2. Ventilator dependent continue supportive care 3. Cavitary pneumonia treated continue to follow 4. Severe sepsis hemodynamically stable 5. Bilateral pulmonary emboli this time daily supportive measures 6. Lobar pneumonia treated   I have personally seen and evaluated the patient, evaluated laboratory and imaging results, formulated the assessment and plan and placed orders. The Patient requires high complexity decision making for assessment and support.  Case was discussed on Rounds with the Respiratory Therapy Staff  Yevonne PaxSaadat A Cathan Gearin, MD [facility] Pulmonary Critical Care Medicine Sleep Medicine

## 2018-09-14 ENCOUNTER — Encounter (HOSPITAL_COMMUNITY): Payer: Medicare Other | Admitting: Anesthesiology

## 2018-09-14 ENCOUNTER — Other Ambulatory Visit (HOSPITAL_COMMUNITY): Payer: Medicare Other

## 2018-09-14 ENCOUNTER — Encounter
Admission: AD | Disposition: A | Discharge status: Skilled Nursing Facility | Payer: Self-pay | Attending: Internal Medicine

## 2018-09-14 ENCOUNTER — Inpatient Hospital Stay: Admission: AD | Admit: 2018-09-14 | Payer: Medicare Other | Admitting: Internal Medicine

## 2018-09-14 ENCOUNTER — Inpatient Hospital Stay: Admit: 2018-09-14 | Payer: Medicare Other | Admitting: Otolaryngology

## 2018-09-14 DIAGNOSIS — J9621 Acute and chronic respiratory failure with hypoxia: Secondary | ICD-10-CM | POA: Diagnosis not present

## 2018-09-14 DIAGNOSIS — J181 Lobar pneumonia, unspecified organism: Secondary | ICD-10-CM | POA: Diagnosis not present

## 2018-09-14 DIAGNOSIS — J189 Pneumonia, unspecified organism: Secondary | ICD-10-CM | POA: Diagnosis not present

## 2018-09-14 DIAGNOSIS — I2699 Other pulmonary embolism without acute cor pulmonale: Secondary | ICD-10-CM | POA: Diagnosis not present

## 2018-09-14 HISTORY — PX: TRACHEOSTOMY TUBE PLACEMENT: SHX814

## 2018-09-14 LAB — HEPARIN LEVEL (UNFRACTIONATED)
Heparin Unfractionated: <0.1 IU/mL — ABNORMAL LOW (ref 0.30–0.70)
Heparin Unfractionated: 0.25 IU/mL — ABNORMAL LOW (ref 0.30–0.70)

## 2018-09-14 SURGERY — CREATION, TRACHEOSTOMY
Anesthesia: General

## 2018-09-14 SURGERY — CREATION, TRACHEOSTOMY
Anesthesia: General | Site: Neck

## 2018-09-14 MED ORDER — ONDANSETRON HCL 4 MG/2ML IJ SOLN
INTRAMUSCULAR | Status: DC | PRN
  Administered 2018-09-14: 4 mg via INTRAVENOUS

## 2018-09-14 MED ORDER — PHENYLEPHRINE HCL (PRESSORS) 10 MG/ML IV SOLN
INTRAVENOUS | Status: DC | PRN
  Administered 2018-09-14 (×4): 80 ug via INTRAVENOUS

## 2018-09-14 MED ORDER — LIDOCAINE-EPINEPHRINE 1 %-1:100000 IJ SOLN
INTRAMUSCULAR | Status: DC | PRN
  Administered 2018-09-14: 5 mL

## 2018-09-14 MED ORDER — DEXAMETHASONE SODIUM PHOSPHATE 10 MG/ML IJ SOLN
INTRAMUSCULAR | Status: DC | PRN
  Administered 2018-09-14: 10 mg via INTRAVENOUS

## 2018-09-14 MED ORDER — PROPOFOL 10 MG/ML IV BOLUS
INTRAVENOUS | Status: AC
  Filled 2018-09-14: qty 20

## 2018-09-14 MED ORDER — EPHEDRINE SULFATE 50 MG/ML IJ SOLN
INTRAMUSCULAR | Status: DC | PRN
  Administered 2018-09-14: 10 mg via INTRAVENOUS

## 2018-09-14 MED ORDER — PROPOFOL 10 MG/ML IV BOLUS
INTRAVENOUS | Status: DC | PRN
  Administered 2018-09-14: 120 mg via INTRAVENOUS

## 2018-09-14 MED ORDER — 0.9 % SODIUM CHLORIDE (POUR BTL) OPTIME
TOPICAL | Status: DC | PRN
  Administered 2018-09-14: 08:00:00 1000 mL

## 2018-09-14 MED ORDER — MIDAZOLAM HCL 5 MG/5ML IJ SOLN
INTRAMUSCULAR | Status: DC | PRN
  Administered 2018-09-14: 2 mg via INTRAVENOUS

## 2018-09-14 MED ORDER — ROCURONIUM BROMIDE 100 MG/10ML IV SOLN
INTRAVENOUS | Status: DC | PRN
  Administered 2018-09-14: 70 mg via INTRAVENOUS

## 2018-09-14 MED ORDER — CEFAZOLIN SODIUM-DEXTROSE 2-3 GM-%(50ML) IV SOLR
INTRAVENOUS | Status: DC | PRN
  Administered 2018-09-14: 2 g via INTRAVENOUS

## 2018-09-14 MED ORDER — LACTATED RINGERS IV SOLN
INTRAVENOUS | Status: DC | PRN
  Administered 2018-09-14: 08:00:00 via INTRAVENOUS

## 2018-09-14 MED ORDER — MIDAZOLAM HCL 2 MG/2ML IJ SOLN
INTRAMUSCULAR | Status: AC
  Filled 2018-09-14: qty 2

## 2018-09-14 MED ORDER — FENTANYL CITRATE (PF) 100 MCG/2ML IJ SOLN
INTRAMUSCULAR | Status: DC | PRN
  Administered 2018-09-14: 50 ug via INTRAVENOUS
  Administered 2018-09-14 (×2): 100 ug via INTRAVENOUS

## 2018-09-14 MED ORDER — LIDOCAINE-EPINEPHRINE 1 %-1:100000 IJ SOLN
INTRAMUSCULAR | Status: AC
  Filled 2018-09-14: qty 1

## 2018-09-14 MED ORDER — FENTANYL CITRATE (PF) 250 MCG/5ML IJ SOLN
INTRAMUSCULAR | Status: AC
  Filled 2018-09-14: qty 5

## 2018-09-14 SURGICAL SUPPLY — 40 items
BLADE SURG 15 STRL LF DISP TIS (BLADE) ×1 IMPLANT
BLADE SURG 15 STRL SS (BLADE) ×2
CLEANER TIP ELECTROSURG 2X2 (MISCELLANEOUS) ×3 IMPLANT
COVER SURGICAL LIGHT HANDLE (MISCELLANEOUS) ×3 IMPLANT
COVER WAND RF STERILE (DRAPES) ×3 IMPLANT
DRAPE HALF SHEET 40X57 (DRAPES) ×2 IMPLANT
ELECT COATED BLADE 2.86 ST (ELECTRODE) ×3 IMPLANT
ELECT REM PT RETURN 9FT ADLT (ELECTROSURGICAL) ×3
ELECTRODE REM PT RTRN 9FT ADLT (ELECTROSURGICAL) ×1 IMPLANT
GAUZE 4X4 16PLY RFD (DISPOSABLE) ×3 IMPLANT
GEL ULTRASOUND 20GR AQUASONIC (MISCELLANEOUS) ×3 IMPLANT
GLOVE BIOGEL PI IND STRL 6.5 (GLOVE) IMPLANT
GLOVE BIOGEL PI INDICATOR 6.5 (GLOVE) ×2
GLOVE SS BIOGEL STRL SZ 7.5 (GLOVE) ×1 IMPLANT
GLOVE SUPERSENSE BIOGEL SZ 7.5 (GLOVE) ×2
GLOVE SURG SS PI 6.5 STRL IVOR (GLOVE) ×2 IMPLANT
GOWN STRL REUS W/ TWL LRG LVL3 (GOWN DISPOSABLE) ×1 IMPLANT
GOWN STRL REUS W/ TWL XL LVL3 (GOWN DISPOSABLE) ×1 IMPLANT
GOWN STRL REUS W/TWL LRG LVL3 (GOWN DISPOSABLE) ×4
GOWN STRL REUS W/TWL XL LVL3 (GOWN DISPOSABLE) ×2
HOLDER TRACH TUBE VELCRO 19.5 (MISCELLANEOUS) ×3 IMPLANT
KIT BASIN OR (CUSTOM PROCEDURE TRAY) ×3 IMPLANT
KIT SUCTION CATH 14FR (SUCTIONS) ×3 IMPLANT
KIT TURNOVER KIT B (KITS) ×3 IMPLANT
NDL HYPO 25GX1X1/2 BEV (NEEDLE) ×1 IMPLANT
NEEDLE HYPO 25GX1X1/2 BEV (NEEDLE) ×3 IMPLANT
NS IRRIG 1000ML POUR BTL (IV SOLUTION) ×3 IMPLANT
PACK EENT II TURBAN DRAPE (CUSTOM PROCEDURE TRAY) ×3 IMPLANT
PAD ARMBOARD 7.5X6 YLW CONV (MISCELLANEOUS) ×4 IMPLANT
PENCIL BUTTON HOLSTER BLD 10FT (ELECTRODE) ×3 IMPLANT
SPLINT NASAL DOYLE BI-VL (GAUZE/BANDAGES/DRESSINGS) ×2 IMPLANT
SPONGE DRAIN TRACH 4X4 STRL 2S (GAUZE/BANDAGES/DRESSINGS) ×3 IMPLANT
SPONGE INTESTINAL PEANUT (DISPOSABLE) ×3 IMPLANT
SUT SILK 2 0 SH CR/8 (SUTURE) ×3 IMPLANT
SUT SILK 3 0 TIES 10X30 (SUTURE) ×2 IMPLANT
SYR 5ML LUER SLIP (SYRINGE) ×3 IMPLANT
SYR CONTROL 10ML LL (SYRINGE) ×3 IMPLANT
TOWEL GREEN STERILE FF (TOWEL DISPOSABLE) ×2 IMPLANT
TUBE TRACH SHILEY  6 DIST  CUF (TUBING) ×2 IMPLANT
TUBE TRACH SHILEY 8 DIST CUF (TUBING) IMPLANT

## 2018-09-14 NOTE — Transfer of Care (Signed)
Immediate Anesthesia Transfer of Care Note  Patient: Monica Plan Finley  Procedure(s) Performed: TRACHEOSTOMY (N/A Neck)  [facility]  Anesthesia Type:General  Level of Consciousness: Patient remains intubated per anesthesia plan  Airway & Oxygen Therapy: Patient remains intubated per anesthesia plan and Patient placed on Ventilator (see vital sign flow sheet for setting)  Post-op Assessment: Report given to RN and Post -op Vital signs reviewed and stable  Post vital signs: Reviewed and stable  Last Vitals:  Vitals Value Taken Time  BP    Temp    Pulse    Resp    SpO2      Last Pain: There were no vitals filed for this visit.       Complications: No apparent anesthesia complications

## 2018-09-14 NOTE — H&P (Signed)
PREOPERATIVE H&P  Chief Complaint: Acute on chronic respiratory failure  HPI: Monica Finley is a 78 y.o. female who presents for evaluation of acute on chronic respiratory failure.  I was consulted for tracheostomy.  Patient is a nursing home resident who was found unresponsive in the nursing home and was transferred to the local ED.  She had pneumonia with septic shock and pulmonary emboli.  She was intubated at the outside hospital and received initial treatment in ICU on.  This occurred on 08/20/2018.  [facility] intubated and on vent.  They have been unable to wean her off the vent and tracheotomy was recommended.  She is taken to the OR at this time for tracheotomy.  Past Medical History:  Diagnosis Date  . Acute on chronic respiratory failure with hypoxia (HCC)   . Bilateral pulmonary embolism (HCC)   . Cavitary pneumonia   . Lobar pneumonia, unspecified organism (HCC)   . Severe sepsis (HCC)   . Ventilator dependent [facility])     Social History   Socioeconomic History  . Marital status: Widowed    Spouse name: Not on file  . Number of children: Not on file  . Years of education: Not on file  . Highest education level: Not on file  Occupational History  . Not on file  Social Needs  . Financial resource strain: Not on file  . Food insecurity:    Worry: Not on file    Inability: Not on file  . Transportation needs:    Medical: Not on file    Non-medical: Not on file  Tobacco Use  . Smoking status: Not on file  Substance and Sexual Activity  . Alcohol use: Not on file  . Drug use: Not on file  . Sexual activity: Not on file  Lifestyle  . Physical activity:    Days per week: Not on file    Minutes per session: Not on file  . Stress: Not on file  Relationships  . Social connections:    Talks on phone: Not on file    Gets together: Not on file    Attends religious service: Not on file    Active member  of club or organization: Not on file    Attends meetings of clubs or organizations: Not on file    Relationship status: Not on file  Other Topics Concern  . Not on file  Social History Narrative  . Not on file   No family history on file. No Known Allergies Prior to Admission medications   Not on File     Positive ROS: Negative  All other systems have been reviewed and were otherwise negative with the exception of those mentioned in the HPI and as above.  Physical Exam: There were no vitals filed for this visit.  General: Intubated on vent Nasal: Clear nasal passages Neck: No palpable adenopathy or thyroid nodules.  Trachea midline. Cardiovascular: Regular rate and rhythm, no murmur.  Respiratory: Clear to auscultation   Assessment/Plan: resp failure Plan for Procedure(s): TRACHEOSTOMY   Dillard Cannon, MD 09/14/2018 7:29 AM

## 2018-09-14 NOTE — Progress Notes (Addendum)
Pulmonary Critical Care Medicine [facility] [place]   PULMONARY CRITICAL CARE SERVICE  PROGRESS NOTE  Date of Service: 09/14/2018  Monica Finley  YWV:[id number]  DOB: [birth year]   DOA: 09/03/2018  Referring Physician: Carron Curie, MD  HPI: Monica Finley is a 78 y.o. female seen for follow up of Acute on Chronic Respiratory Failure.  Patient recently returned from the OR where she had a #6 Shiley tracheostomy placed.  Patient is currently on pressure support 12/5 with an FiO2 of 28% doing well with good saturations.  Medications: Reviewed on Rounds  Physical Exam:  Vitals: Pulse 84 respirations 18 BP 173/73 O2 sat 100% temp 98.9  Ventilator Settings pressure support 12/5 with an FiO2 of 28%  . General: Comfortable at this time . Eyes: Grossly normal lids, irises & conjunctiva . ENT: grossly tongue is normal . Neck: no obvious mass . Cardiovascular: S1 S2 normal no gallop . Respiratory: Coarse breath sounds . Abdomen: soft . Skin: no rash seen on limited exam . Musculoskeletal: not rigid . Psychiatric:unable to assess . Neurologic: no seizure no involuntary movements         Lab Data:   Basic Metabolic Panel: Recent Labs  Lab 09/08/18 0549 09/12/18 0645  NA 137 131*  K 4.5 3.9  CL 99 92*  CO2 28 25  GLUCOSE 182* 186*  BUN 33* 22  CREATININE 0.78 0.71  CALCIUM 8.5* 8.4*    ABG: No results for input(s): PHART, PCO2ART, PO2ART, HCO3, O2SAT in the last 168 hours.  Liver Function Tests: No results for input(s): AST, ALT, ALKPHOS, BILITOT, PROT, ALBUMIN in the last 168 hours. No results for input(s): LIPASE, AMYLASE in the last 168 hours. No results for input(s): AMMONIA in the last 168 hours.  CBC: Recent Labs  Lab 09/08/18 0549 09/09/18 0649 09/12/18 0645 09/13/18 0541  WBC 21.7* 21.9* 18.3* 15.5*  HGB 7.5* 7.6* 6.8* 9.6*  HCT 23.5* 23.6* 21.1* 29.1*  MCV 91.8 90.4 89.0 89.0  PLT 233 247 268 273    Cardiac Enzymes: No  results for input(s): CKTOTAL, CKMB, CKMBINDEX, TROPONINI in the last 168 hours.  BNP (last 3 results) No results for input(s): BNP in the last 8760 hours.  ProBNP (last 3 results) No results for input(s): PROBNP in the last 8760 hours.  Radiological Exams: Ct Chest W Contrast  Result Date: 09/13/2018 CLINICAL DATA:  Follow-up pulmonary nodule EXAM: CT CHEST WITHOUT CONTRAST TECHNIQUE: Multidetector CT imaging of the chest was performed following the standard protocol without IV contrast. COMPARISON:  Outside chest CT report 08/20/2018 FINDINGS: Contrast was requested and attempted, with extravasation of approximately 35 cc of contrast into the ventral forearm. There is some swelling without skin changes upon my inspection. The patient is intubated and sensory exam is limited. There is bruising at the antecubital fossa and in the dorsal hand which was present previously based on coloration and per report. The remaining IV is in the hand and is tenuous per report. Cardiovascular: Normal heart size. No pericardial effusion. Coronary and aortic atherosclerosis. Mediastinum/Nodes: Negative for adenopathy. The endotracheal tube tip is above the carina and the orogastric tube reaches the stomach at least. Lungs/Pleura: There is patchy clustered ground-glass and nodular opacity primarily in the left lung. Irregularly-shaped pulmonary nodule in the right upper lobe measuring up to 13 mm. This matches the size described on prior study but cavitary features are not seen in distinction with prior. No effusion or pneumothorax. Upper Abdomen: No acute finding. Musculoskeletal: Spinal  degeneration. Bilateral glenohumeral arthroplasty. IMPRESSION: 1. 13 mm irregular pulmonary nodule in the right upper lobe, size stable from CT report last month. If appropriate for comorbidities, consider one of the following in 3 months: (A) repeat chest CT, (b) follow-up PET-CT, or (c) tissue sampling. This recommendation follows the  consensus statement: Guidelines for Management of Incidental Pulmonary Nodules Detected on CT Images: From the Fleischner Society 2017; Radiology 2017; 284:228-243. 2. Mild inflammatory appearing lung opacity elsewhere, which correlates with history of pneumonia on prior CT. 3. Noncontrast study was performed due to limited vascular access. 35 cc was infiltrated in the left ventral forearm. Electronically Signed   By: Marnee SpringJonathon  Watts M.D.   On: 09/13/2018 12:02   Dg Abd Portable 1v  Result Date: 09/14/2018 CLINICAL DATA:  Status post gastric catheter placement EXAM: PORTABLE ABDOMEN - 1 VIEW COMPARISON:  None. FINDINGS: Gastric tube is noted within the stomach. Scattered large and small bowel gas is seen. No free air is noted. Degenerative change of the lumbar spine is seen. IMPRESSION: Gastric catheter within the stomach. Electronically Signed   By: Alcide CleverMark  Lukens M.D.   On: 09/14/2018 10:25    Assessment/Plan Active Problems:   Acute on chronic respiratory failure with hypoxia (HCC)   Ventilator dependent (HCC)   Cavitary pneumonia   Bilateral pulmonary embolism (HCC)   Severe sepsis (HCC)   Lobar pneumonia, unspecified organism (HCC)   1. Acute on chronic respiratory failure with hypoxia patient had a #6 Shiley placed in OR today.  We will continue pressure support mode at this time continue to wean as tolerated.  Continue pulmonary toilet and secretion management 2. Ventilator dependent continue supportive care 3. Cavitary pneumonia treated continue to follow 4. Severe sepsis hemodynamically stable 5. Bilateral pulmonary emboli continue supportive measures 6. Lobar pneumonia treated   I have personally seen and evaluated the patient, evaluated laboratory and imaging results, formulated the assessment and plan and placed orders. The Patient requires high complexity decision making for assessment and support.  Case was discussed on Rounds with the Respiratory Therapy Staff  Yevonne PaxSaadat A  Prue Lingenfelter, MD [facility] Pulmonary Critical Care Medicine Sleep Medicine

## 2018-09-14 NOTE — Brief Op Note (Signed)
09/14/2018  8:51 AM  PATIENT:  Monica Finley  78 y.o. female  PRE-OPERATIVE DIAGNOSIS:  resp failure  POST-OPERATIVE DIAGNOSIS:  resp failure  PROCEDURE:  Procedure(s): TRACHEOSTOMY (N/A) # 6 Shiley  SURGEON:  Surgeon(s) and Role:    Drema Halon, MD - Primary  PHYSICIAN ASSISTANT:   ASSISTANTS: none   ANESTHESIA:   general  EBL:  Minimal  BLOOD ADMINISTERED:none  DRAINS: none   LOCAL MEDICATIONS USED:  XYLOCAINE   SPECIMEN:  No Specimen  DISPOSITION OF SPECIMEN:  N/A  COUNTS:  YES  TOURNIQUET:  * No tourniquets in log *  DICTATION: .Other Dictation: Dictation Number [id number]  PLAN OF CARE: Discharge to home after PACU  PATIENT DISPOSITION:  PACU - hemodynamically stable.   Delay start of Pharmacological VTE agent (>24hrs) due to surgical blood loss or risk of bleeding: yes

## 2018-09-14 NOTE — Anesthesia Postprocedure Evaluation (Signed)
Anesthesia Post Note  Patient: Monica Finley  Procedure(s) Performed: TRACHEOSTOMY (N/A Neck)     Patient location during evaluation: SICU Anesthesia Type: General Level of consciousness: sedated Pain management: pain level controlled Vital Signs Assessment: post-procedure vital signs reviewed and stable Respiratory status: patient remains intubated per anesthesia plan (tracheostomy) Cardiovascular status: stable Postop Assessment: no apparent nausea or vomiting Anesthetic complications: no    Last Vitals: There were no vitals filed for this visit.  Last Pain: There were no vitals filed for this visit.               Lucretia Kern

## 2018-09-14 NOTE — Interval H&P Note (Signed)
History and Physical Interval Note:  09/14/2018 7:34 AM  [name]  has presented today for surgery, with the diagnosis of resp failure.  The various methods of treatment have been discussed with the patient and family. After consideration of risks, benefits and other options for treatment, the patient has consented to  Procedure(s): TRACHEOSTOMY (N/A) as a surgical intervention.  The patient's history has been reviewed, patient examined, no change in status, stable for surgery.  I have reviewed the patient's chart and labs.  Questions were answered to the patient's satisfaction.     Monica Finley

## 2018-09-14 NOTE — Op Note (Signed)
NAME: Monica Finley, BAGLIO MEDICAL RECORD FS:[id number] ACCOUNT [id number] DATE OF BIRTH:December 01, 1940 FACILITY: [facility] LOCATION: [facility] PHYSICIAN:Kellar Westberg Braxton Feathers, MD  OPERATIVE REPORT  DATE OF PROCEDURE:  09/14/2018  PREOPERATIVE DIAGNOSIS:  Acute on chronic respiratory failure.  POSTOPERATIVE DIAGNOSIS:  Acute on chronic respiratory failure.  OPERATION PERFORMED:  Tracheostomy with a #6 Shiley cuffed tube.  SURGEON:  Dillard Cannon, MD  ANESTHESIA:  General endotracheal.  COMPLICATIONS:  None.  ESTIMATED BLOOD LOSS:  Minimal.  BRIEF CLINICAL NOTE:  The patient is a 78 year old female who is a nursing home resident.  She was apparently found in a nursing home about a month ago unresponsive and was emergently transferred to a local ER where she was admitted to the ICU with acute  respiratory failure as well as pneumonia and PE.  She developed subsequent sepsis, was treated, and was subsequently transferred to [facility].  Approximately 2 weeks later, intubated on a ventilator.  Attempts at weaning the patient had  been unsuccessful at [facility], and a tracheostomy was recommended.  The patient has been intubated for about 4 weeks now.  She is taken to the operating room at this time for a tracheostomy.  DESCRIPTION OF PROCEDURE:  The patient was brought straight down from [facility] to the operating room.  She remained in her bed.  A roll was placed underneath her shoulders to extend her neck.  The trachea was palpably midline.  The area  was prepped with Betadine solution and draped in sterile towels.  The proposed incision site was marked out and injected with 4-5 mL of Xylocaine with epinephrine for hemostasis.  A vertical incision was made midline just below the cricoid cartilage.   Dissection was carried down through the subcutaneous tissue, and strap muscles were divided in the midline and retracted laterally.  Thyroid  isthmus was identified.  The cricoid was kind of low, just sitting just above the cricoid isthmus.  The thyroid  isthmus had to be divided with cautery.  The first 3 tracheal rings were exposed.  Horizontal tracheotomy was performed between the first and second tracheal rings.  An inferiorly based flap was cut with scissors.  The #6 Shiley tube was inserted in the  tracheotomy without any difficulty.  The patient was ventilated well.  The tracheostomy was then secured to the neck with 2-0 silk sutures x4 and a Velcro trach collar around the neck.  The patient was subsequently transferred back to [facility].  Of note, the orogastric tube was removed and a nasogastric tube was placed by CRNA prior to transfer back to [facility].  LN/NUANCE  D:09/14/2018 T:09/14/2018 JOB:[id number]

## 2018-09-15 ENCOUNTER — Encounter (HOSPITAL_COMMUNITY): Payer: Self-pay | Admitting: Otolaryngology

## 2018-09-15 DIAGNOSIS — J9621 Acute and chronic respiratory failure with hypoxia: Secondary | ICD-10-CM | POA: Diagnosis not present

## 2018-09-15 DIAGNOSIS — J189 Pneumonia, unspecified organism: Secondary | ICD-10-CM | POA: Diagnosis not present

## 2018-09-15 DIAGNOSIS — J181 Lobar pneumonia, unspecified organism: Secondary | ICD-10-CM | POA: Diagnosis not present

## 2018-09-15 DIAGNOSIS — I2699 Other pulmonary embolism without acute cor pulmonale: Secondary | ICD-10-CM | POA: Diagnosis not present

## 2018-09-15 LAB — HEPARIN LEVEL (UNFRACTIONATED)
Heparin Unfractionated: 0.19 IU/mL — ABNORMAL LOW (ref 0.30–0.70)
Heparin Unfractionated: 0.38 IU/mL (ref 0.30–0.70)
Heparin Unfractionated: 0.26 IU/mL — ABNORMAL LOW (ref 0.30–0.70)

## 2018-09-15 LAB — CBC WITH DIFFERENTIAL/PLATELET
Abs Immature Granulocytes: 0.28 10*3/uL — ABNORMAL HIGH (ref 0.00–0.07)
Basophils Absolute: 0 10*3/uL (ref 0.0–0.1)
Basophils Relative: 0 %
Eosinophils Absolute: 0.1 10*3/uL (ref 0.0–0.5)
Eosinophils Relative: 1 %
HCT: 27.7 % — ABNORMAL LOW (ref 36.0–46.0)
Hemoglobin: 9.2 g/dL — ABNORMAL LOW (ref 12.0–15.0)
Immature Granulocytes: 2 %
Lymphocytes Relative: 10 %
Lymphs Abs: 1.3 10*3/uL (ref 0.7–4.0)
MCH: 29.5 pg (ref 26.0–34.0)
MCHC: 33.2 g/dL (ref 30.0–36.0)
MCV: 88.8 fL (ref 80.0–100.0)
Monocytes Absolute: 0.8 10*3/uL (ref 0.1–1.0)
Monocytes Relative: 6 %
Neutro Abs: 10.4 10*3/uL — ABNORMAL HIGH (ref 1.7–7.7)
Neutrophils Relative %: 81 %
Platelets: 343 10*3/uL (ref 150–400)
RBC: 3.12 MIL/uL — ABNORMAL LOW (ref 3.87–5.11)
RDW: 15.1 % (ref 11.5–15.5)
WBC: 12.9 10*3/uL — ABNORMAL HIGH (ref 4.0–10.5)
nRBC: 0 % (ref 0.0–0.2)

## 2018-09-15 LAB — OCCULT BLOOD X 1 CARD TO LAB, STOOL: Fecal Occult Bld: POSITIVE — AB

## 2018-09-15 NOTE — Progress Notes (Addendum)
Pulmonary Critical Care Medicine [facility] [place]   PULMONARY CRITICAL CARE SERVICE  PROGRESS NOTE  Date of Service: 09/15/2018  Monica Finley  FOY:[id number]  DOB: 1940-10-04   DOA: 09/03/2018  Referring Physician: Carron Curie, MD  HPI: Monica Finley is a 78 y.o. female seen for follow up of Acute on Chronic Respiratory Failure.  Patient continues on pressure support 12/5 with an FiO2 of 28% as tolerated.  Managed to do 14 hours yesterday.  If she is again able to complete 14 to 16 hours today will start aerosol trach collar trials tomorrow.  Medications: Reviewed on Rounds  Physical Exam:  Vitals: Pulse 83 respirations 20 BP 166/64 O2 sat 100% temp 98.9  Ventilator Settings pressure support 12/5 FiO2 28%  . General: Comfortable at this time . Eyes: Grossly normal lids, irises & conjunctiva . ENT: grossly tongue is normal . Neck: no obvious mass . Cardiovascular: S1 S2 normal no gallop . Respiratory: Coarse breath sounds . Abdomen: soft . Skin: no rash seen on limited exam . Musculoskeletal: not rigid . Psychiatric:unable to assess . Neurologic: no seizure no involuntary movements         Lab Data:   Basic Metabolic Panel: Recent Labs  Lab 09/12/18 0645  NA 131*  K 3.9  CL 92*  CO2 25  GLUCOSE 186*  BUN 22  CREATININE 0.71  CALCIUM 8.4*    ABG: No results for input(s): PHART, PCO2ART, PO2ART, HCO3, O2SAT in the last 168 hours.  Liver Function Tests: No results for input(s): AST, ALT, ALKPHOS, BILITOT, PROT, ALBUMIN in the last 168 hours. No results for input(s): LIPASE, AMYLASE in the last 168 hours. No results for input(s): AMMONIA in the last 168 hours.  CBC: Recent Labs  Lab 09/09/18 0649 09/12/18 0645 09/13/18 0541 09/15/18 0451  WBC 21.9* 18.3* 15.5* 12.9*  NEUTROABS  --   --   --  10.4*  HGB 7.6* 6.8* 9.6* 9.2*  HCT 23.6* 21.1* 29.1* 27.7*  MCV 90.4 89.0 89.0 88.8  PLT 247 268 273 343    Cardiac Enzymes: No  results for input(s): CKTOTAL, CKMB, CKMBINDEX, TROPONINI in the last 168 hours.  BNP (last 3 results) No results for input(s): BNP in the last 8760 hours.  ProBNP (last 3 results) No results for input(s): PROBNP in the last 8760 hours.  Radiological Exams: Dg Chest Port 1 View  Result Date: 09/14/2018 CLINICAL DATA:  PICC line placement EXAM: PORTABLE CHEST 1 VIEW COMPARISON:  09/07/2018, CT 09/13/2018 FINDINGS: Surgical hardware in the cervical spine. Tracheostomy tube tip about 2.3 cm superior to carina. Esophageal tube tip below the diaphragm but non included. Right upper extremity central venous catheter tip projects over the mid right atrium. Low lung volumes without focal opacity. Stable cardiomediastinal silhouette with aortic atherosclerosis. IMPRESSION: 1. Right upper extremity catheter tip overlies the mid right atrium 2. Low lung volumes without focal airspace disease Electronically Signed   By: Jasmine Pang M.D.   On: 09/14/2018 20:03   Dg Abd Portable 1v  Result Date: 09/14/2018 CLINICAL DATA:  Status post gastric catheter placement EXAM: PORTABLE ABDOMEN - 1 VIEW COMPARISON:  None. FINDINGS: Gastric tube is noted within the stomach. Scattered large and small bowel gas is seen. No free air is noted. Degenerative change of the lumbar spine is seen. IMPRESSION: Gastric catheter within the stomach. Electronically Signed   By: Alcide Clever M.D.   On: 09/14/2018 10:25    Assessment/Plan Active Problems:   Acute  on chronic respiratory failure with hypoxia (HCC)   Ventilator dependent (HCC)   Cavitary pneumonia   Bilateral pulmonary embolism (HCC)   Severe sepsis (HCC)   Lobar pneumonia, unspecified organism (HCC)   1. Acute on chronic respiratory failure with hypoxia patient will continue pressure support today and begin aerosol trach collar trials tomorrow as patient can tolerate.  Continue with aggressive pulmonary toilet and secretion management 2. Ventilator dependent  continue supportive care 3. Cavitary pneumonia treated continue to follow 4. Severe sepsis hemodynamically stable 5. Bilateral pulmonary emboli continue supportive measures 6. Lobar pneumonia treated   I have personally seen and evaluated the patient, evaluated laboratory and imaging results, formulated the assessment and plan and placed orders. The Patient requires high complexity decision making for assessment and support.  Case was discussed on Rounds with the Respiratory Therapy Staff  Yevonne PaxSaadat A Eulogio Requena, MD [facility] Pulmonary Critical Care Medicine Sleep Medicine

## 2018-09-16 DIAGNOSIS — J9621 Acute and chronic respiratory failure with hypoxia: Secondary | ICD-10-CM | POA: Diagnosis not present

## 2018-09-16 DIAGNOSIS — I2699 Other pulmonary embolism without acute cor pulmonale: Secondary | ICD-10-CM | POA: Diagnosis not present

## 2018-09-16 DIAGNOSIS — J181 Lobar pneumonia, unspecified organism: Secondary | ICD-10-CM | POA: Diagnosis not present

## 2018-09-16 DIAGNOSIS — J189 Pneumonia, unspecified organism: Secondary | ICD-10-CM | POA: Diagnosis not present

## 2018-09-16 LAB — HEPARIN LEVEL (UNFRACTIONATED)
Heparin Unfractionated: 0.15 IU/mL — ABNORMAL LOW (ref 0.30–0.70)
Heparin Unfractionated: 0.29 IU/mL — ABNORMAL LOW (ref 0.30–0.70)
Heparin Unfractionated: 0.34 IU/mL (ref 0.30–0.70)
Heparin Unfractionated: 0.34 IU/mL (ref 0.30–0.70)

## 2018-09-16 LAB — VANCOMYCIN, TROUGH: Vancomycin Tr: 15 ug/mL (ref 15–20)

## 2018-09-16 NOTE — Progress Notes (Addendum)
[facility] [place]   PULMONARY CRITICAL CARE SERVICE  PROGRESS NOTE  Date of Service: 09/16/2018  Marvel PlanMargarita O Finley  ZOX:[id number]  DOB: 1941/01/04   DOA: 09/03/2018  Referring Physician: Carron CurieAli Hijazi, MD  HPI: Monica DaceMargarita O Frier is a 78 y.o. female seen for follow up of Acute on Chronic Respiratory Failure.  Patient was able to do 4 hours on aerosol trach collar 28% FiO2 today and is now resting back on pressure support.  No acute distress is noted at this time.  Patient remains afebrile.  Medications: Reviewed on Rounds  Physical Exam:  Vitals: Pulse 81 respirations 24 BP 170/91 O2 sat 100% temp 99.6  Ventilator Settings pressure support 12/5 FiO2 28%  . General: Comfortable at this time . Eyes: Grossly normal lids, irises & conjunctiva . ENT: grossly tongue is normal . Neck: no obvious mass . Cardiovascular: S1 S2 normal no gallop . Respiratory: Coarse breath sounds . Abdomen: soft . Skin: no rash seen on limited exam . Musculoskeletal: not rigid . Psychiatric:unable to assess . Neurologic: no seizure no involuntary movements         Lab Data:   Basic Metabolic Panel: Recent Labs  Lab 09/12/18 0645  NA 131*  K 3.9  CL 92*  CO2 25  GLUCOSE 186*  BUN 22  CREATININE 0.71  CALCIUM 8.4*    ABG: No results for input(s): PHART, PCO2ART, PO2ART, HCO3, O2SAT in the last 168 hours.  Liver Function Tests: No results for input(s): AST, ALT, ALKPHOS, BILITOT, PROT, ALBUMIN in the last 168 hours. No results for input(s): LIPASE, AMYLASE in the last 168 hours. No results for input(s): AMMONIA in the last 168 hours.  CBC: Recent Labs  Lab 09/12/18 0645 09/13/18 0541 09/15/18 0451  WBC 18.3* 15.5* 12.9*  NEUTROABS  --   --  10.4*  HGB 6.8* 9.6* 9.2*  HCT 21.1* 29.1* 27.7*  MCV 89.0 89.0 88.8  PLT 268 273 343    Cardiac Enzymes: No results for input(s): CKTOTAL, CKMB, CKMBINDEX, TROPONINI in the last 168  hours.  BNP (last 3 results) No results for input(s): BNP in the last 8760 hours.  ProBNP (last 3 results) No results for input(s): PROBNP in the last 8760 hours.  Radiological Exams: Dg Chest Port 1 View  Result Date: 09/14/2018 CLINICAL DATA:  PICC line placement EXAM: PORTABLE CHEST 1 VIEW COMPARISON:  09/07/2018, CT 09/13/2018 FINDINGS: Surgical hardware in the cervical spine. Tracheostomy tube tip about 2.3 cm superior to carina. Esophageal tube tip below the diaphragm but non included. Right upper extremity central venous catheter tip projects over the mid right atrium. Low lung volumes without focal opacity. Stable cardiomediastinal silhouette with aortic atherosclerosis. IMPRESSION: 1. Right upper extremity catheter tip overlies the mid right atrium 2. Low lung volumes without focal airspace disease Electronically Signed   By: Jasmine PangKim  Fujinaga M.D.   On: 09/14/2018 20:03    Assessment/Plan Active Problems:   Acute on chronic respiratory failure with hypoxia (HCC)   Ventilator dependent (HCC)   Cavitary pneumonia   Bilateral pulmonary embolism (HCC)   Severe sepsis (HCC)   Lobar pneumonia, unspecified organism (HCC)   1. Acute on chronic respiratory failure with hypoxia continue pressure support today and continue weaning per protocol.  Continue with secretion management and pulmonary toilet as well. 2. Ventilator dependent continue supportive care 3. Cavitary pneumonia treated continue to follow 4. Severe sepsis hemodynamically stable 5. Bilateral pulmonary emboli continue supportive measures 6. Lobar pneumonia  treated   I have personally seen and evaluated the patient, evaluated laboratory and imaging results, formulated the assessment and plan and placed orders. The Patient requires high complexity decision making for assessment and support.  Case was discussed on Rounds with the Respiratory Therapy Staff  Yevonne Pax, MD [facility] Pulmonary Critical Care Medicine Sleep  Medicine

## 2018-09-16 NOTE — Consult Note (Signed)
Chief Complaint: Patient was seen in consultation today for percutaneous gastric tube placement at the request of Dr Theora Gianotti   Supervising Physician: Richarda Overlie  Patient Status: Select IP  History of Present Illness: Monica Finley is a 78 y.o. female   Hypoxia Trach/on vent resp failure  Dysphagia Protein calorie malnutrition Deconditioning Need for long term care  Request for percutaneous gastric tube placement Imaging has been reviewed and anatomy approved for G tube in IR  Covid neg 09/13/18 MRSA 09/07/18; treated +UTI 5/12: treated  Hep stopped 6 am 5/22 (PE)  Past Medical History:  Diagnosis Date   Acute on chronic respiratory failure with hypoxia (HCC)    Bilateral pulmonary embolism (HCC)    Cavitary pneumonia    Lobar pneumonia, unspecified organism (HCC)    Severe sepsis (HCC)    Ventilator dependent (HCC)     Allergies: Patient has no known allergies.  Medications: Prior to Admission medications   Not on File     No family history on file.  Social History   Socioeconomic History   Marital status: Widowed    Spouse name: Not on file   Number of children: Not on file   Years of education: Not on file   Highest education level: Not on file  Occupational History   Not on file  Social Needs   Financial resource strain: Not on file   Food insecurity:    Worry: Not on file    Inability: Not on file   Transportation needs:    Medical: Not on file    Non-medical: Not on file  Tobacco Use   Smoking status: Not on file  Substance and Sexual Activity   Alcohol use: Not on file   Drug use: Not on file   Sexual activity: Not on file  Lifestyle   Physical activity:    Days per week: Not on file    Minutes per session: Not on file   Stress: Not on file  Relationships   Social connections:    Talks on phone: Not on file    Gets together: Not on file    Attends religious service: Not on file    Active member  of club or organization: Not on file    Attends meetings of clubs or organizations: Not on file    Relationship status: Not on file  Other Topics Concern   Not on file  Social History Narrative   Not on file    Review of Systems: A 12 point ROS discussed and pertinent positives are indicated in the HPI above.  All other systems are negative.   Vital Signs: Ht 5' 2.99" (1.6 m) Comment: 08/20/18   Wt 139 lb 15.9 oz (63.5 kg) Comment: 08/26/18   BMI 24.80 kg/m   Physical Exam Vitals signs reviewed.  Constitutional:      Appearance: She is ill-appearing.  Cardiovascular:     Rate and Rhythm: Normal rate.     Heart sounds: Normal heart sounds.  Pulmonary:     Comments: Trach/vent Abdominal:     General: Bowel sounds are normal.  Musculoskeletal:     Comments: No movement  Skin:    Comments: No response  Psychiatric:     Comments: Consented with Dtr Annice Pih via phone     Imaging: Ct Abdomen Wo Contrast  Result Date: 09/06/2018 CLINICAL DATA:  Preop planning gastrostomy placement EXAM: CT ABDOMEN WITHOUT CONTRAST TECHNIQUE: Multidetector CT imaging of the abdomen was performed following  the standard protocol without IV contrast. COMPARISON:  None. FINDINGS: Lower chest: No pleural or pericardial effusion. Aortic and coronary calcifications. Patchy nodular peripheral opacities in the lung bases left greater than right. Hepatobiliary: No focal liver abnormality is seen. No biliary dilatation. Gallbladder is collapsed or absent. Pancreas: Unremarkable. No pancreatic ductal dilatation or surrounding inflammatory changes. Spleen: Normal in size without focal abnormality. Adrenals/Urinary Tract: Bilateral nephrolithiasis, largest stone or cluster 5 mm in the left upper pole. No hydronephrosis. Unremarkable adrenal glands. Stomach/Bowel: Nasogastric tube loops in the stomach which is nondistended. Safe window for percutaneous gastrostomy is identified. Visualized portions of small bowel and  colon are nondilated, unremarkable. Vascular/Lymphatic: Heavy aortic atheromatous calcifications without aneurysm. Retroaortic left renal vein, an anatomic variant. Other: No ascites.  No free air. Musculoskeletal: Mild wedge deformity of T12, age indeterminate. Spondylitic changes in the visualized lower thoracic and lumbar spine. No worrisome bone lesion. IMPRESSION: 1. Safe percutaneous approach for gastrostomy is identified, without complicating factors. 2. Coronary and Aortic Atherosclerosis (ICD10-170.0). 3. Bilateral nephrolithiasis without hydronephrosis. Electronically Signed   By: Corlis Leak  Hassell M.D.   On: 09/06/2018 16:01   Ct Chest W Contrast  Result Date: 09/13/2018 CLINICAL DATA:  Follow-up pulmonary nodule EXAM: CT CHEST WITHOUT CONTRAST TECHNIQUE: Multidetector CT imaging of the chest was performed following the standard protocol without IV contrast. COMPARISON:  Outside chest CT report 08/20/2018 FINDINGS: Contrast was requested and attempted, with extravasation of approximately 35 cc of contrast into the ventral forearm. There is some swelling without skin changes upon my inspection. The patient is intubated and sensory exam is limited. There is bruising at the antecubital fossa and in the dorsal hand which was present previously based on coloration and per report. The remaining IV is in the hand and is tenuous per report. Cardiovascular: Normal heart size. No pericardial effusion. Coronary and aortic atherosclerosis. Mediastinum/Nodes: Negative for adenopathy. The endotracheal tube tip is above the carina and the orogastric tube reaches the stomach at least. Lungs/Pleura: There is patchy clustered ground-glass and nodular opacity primarily in the left lung. Irregularly-shaped pulmonary nodule in the right upper lobe measuring up to 13 mm. This matches the size described on prior study but cavitary features are not seen in distinction with prior. No effusion or pneumothorax. Upper Abdomen: No acute  finding. Musculoskeletal: Spinal degeneration. Bilateral glenohumeral arthroplasty. IMPRESSION: 1. 13 mm irregular pulmonary nodule in the right upper lobe, size stable from CT report last month. If appropriate for comorbidities, consider one of the following in 3 months: (A) repeat chest CT, (b) follow-up PET-CT, or (c) tissue sampling. This recommendation follows the consensus statement: Guidelines for Management of Incidental Pulmonary Nodules Detected on CT Images: From the Fleischner Society 2017; Radiology 2017; 284:228-243. 2. Mild inflammatory appearing lung opacity elsewhere, which correlates with history of pneumonia on prior CT. 3. Noncontrast study was performed due to limited vascular access. 35 cc was infiltrated in the left ventral forearm. Electronically Signed   By: Marnee SpringJonathon  Watts M.D.   On: 09/13/2018 12:02   Dg Chest Port 1 View  Result Date: 09/14/2018 CLINICAL DATA:  PICC line placement EXAM: PORTABLE CHEST 1 VIEW COMPARISON:  09/07/2018, CT 09/13/2018 FINDINGS: Surgical hardware in the cervical spine. Tracheostomy tube tip about 2.3 cm superior to carina. Esophageal tube tip below the diaphragm but non included. Right upper extremity central venous catheter tip projects over the mid right atrium. Low lung volumes without focal opacity. Stable cardiomediastinal silhouette with aortic atherosclerosis. IMPRESSION: 1. Right upper extremity catheter  tip overlies the mid right atrium 2. Low lung volumes without focal airspace disease Electronically Signed   By: Jasmine Pang M.D.   On: 09/14/2018 20:03   Dg Chest Port 1 View  Result Date: 09/07/2018 CLINICAL DATA:  Intubated patient admitted with pneumonia. EXAM: PORTABLE CHEST 1 VIEW COMPARISON:  Single-view of the chest 09/03/2018. FINDINGS: Endotracheal tube is in good position with the tip just below the clavicular heads. Left IJ approach central venous catheter tip is at the superior cavoatrial junction, unchanged. NG tube tip is just  below the inferior margin of film within the stomach. The lungs are clear. No pneumothorax or pleural effusion. Heart size is normal. Aortic atherosclerosis noted. No acute bony abnormality. IMPRESSION: Support tubes and lines projecting good position. Lungs are clear. Electronically Signed   By: Drusilla Kanner M.D.   On: 09/07/2018 17:03   Dg Chest Port 1 View  Result Date: 09/03/2018 CLINICAL DATA:  ETT EXAM: PORTABLE CHEST 1 VIEW COMPARISON:  None. FINDINGS: Endotracheal tube terminates 4 cm above the carina. Lungs are clear.  No pleural effusion or pneumothorax. The heart is normal in size.  Thoracic aortic atherosclerosis. Left IJ venous catheter terminates the cavoatrial junction. Enteric tube courses into the stomach. IMPRESSION: Endotracheal tube terminates 4 cm above the carina. Additional support apparatus as above. Electronically Signed   By: Charline Bills M.D.   On: 09/03/2018 18:40   Dg Abd Portable 1v  Result Date: 09/14/2018 CLINICAL DATA:  Status post gastric catheter placement EXAM: PORTABLE ABDOMEN - 1 VIEW COMPARISON:  None. FINDINGS: Gastric tube is noted within the stomach. Scattered large and small bowel gas is seen. No free air is noted. Degenerative change of the lumbar spine is seen. IMPRESSION: Gastric catheter within the stomach. Electronically Signed   By: Alcide Clever M.D.   On: 09/14/2018 10:25   Dg Abd Portable 1v  Result Date: 09/03/2018 CLINICAL DATA:  OG tube placement EXAM: PORTABLE ABDOMEN - 1 VIEW COMPARISON:  None. FINDINGS: Enteric tube terminates in the distal gastric body. Nonobstructive bowel gas pattern. Mild degenerative changes of the lumbar spine. IMPRESSION: Enteric tube terminates in the distal gastric body. Electronically Signed   By: Charline Bills M.D.   On: 09/03/2018 18:41    Labs:  CBC: Recent Labs    09/09/18 0649 09/12/18 0645 09/13/18 0541 09/15/18 0451  WBC 21.9* 18.3* 15.5* 12.9*  HGB 7.6* 6.8* 9.6* 9.2*  HCT 23.6* 21.1*  29.1* 27.7*  PLT 247 268 273 343    COAGS: Recent Labs    09/03/18 1811  INR 2.7*    BMP: Recent Labs    09/03/18 1811 09/08/18 0549 09/12/18 0645  NA 135 137 131*  K 4.3 4.5 3.9  CL 99 99 92*  CO2 GLUCOSE 140* 182* 186*  BUN 21 33* 22  CALCIUM 8.4* 8.5* 8.4*  CREATININE 0.87 0.78 0.71  GFRNONAA >60 >60 >60  GFRAA >60 >60 >60    LIVER FUNCTION TESTS: Recent Labs    09/03/18 1811  BILITOT 0.9  AST 26  ALT 37  ALKPHOS 123  PROT 6.5  ALBUMIN 2.6*    TUMOR MARKERS: No results for input(s): AFPTM, CEA, CA199, CHROMGRNA in the last 8760 hours.  Assessment and Plan:  Resp failure Vent/trach Deconditioning Need for long term care Dysphagia Malnutrition Scheduled for percutaneous gastric tube placement Afeb; Covid neg Plan for 5/22 in IR Risks and benefits discussed with the Dtr Annice Pih via phone including, but not  limited to the need for a barium enema during the procedure, bleeding, infection, peritonitis, or damage to adjacent structures.  All of her questions were answered, she is agreeable to proceed. Consent signed and in chart.  Thank you for this interesting consult.  I greatly enjoyed meeting [employer] and look forward to participating in their care.  A copy of this report was sent to the requesting provider on this date.  Electronically Signed: Robet Leu, PA-C 09/16/2018, 11:15 AM   I spent a total of 40 Minutes    in face to face in clinical consultation, greater than 50% of which was counseling/coordinating care for percutaneous gastric tube placement

## 2018-09-17 DIAGNOSIS — I2699 Other pulmonary embolism without acute cor pulmonale: Secondary | ICD-10-CM | POA: Diagnosis not present

## 2018-09-17 DIAGNOSIS — J189 Pneumonia, unspecified organism: Secondary | ICD-10-CM | POA: Diagnosis not present

## 2018-09-17 DIAGNOSIS — J181 Lobar pneumonia, unspecified organism: Secondary | ICD-10-CM | POA: Diagnosis not present

## 2018-09-17 DIAGNOSIS — J9621 Acute and chronic respiratory failure with hypoxia: Secondary | ICD-10-CM | POA: Diagnosis not present

## 2018-09-17 LAB — CBC WITH DIFFERENTIAL/PLATELET
Abs Immature Granulocytes: 0.18 10*3/uL — ABNORMAL HIGH (ref 0.00–0.07)
Basophils Absolute: 0 10*3/uL (ref 0.0–0.1)
Basophils Relative: 0 %
Eosinophils Absolute: 0.2 10*3/uL (ref 0.0–0.5)
Eosinophils Relative: 2 %
HCT: 27.7 % — ABNORMAL LOW (ref 36.0–46.0)
Hemoglobin: 9.1 g/dL — ABNORMAL LOW (ref 12.0–15.0)
Immature Granulocytes: 2 %
Lymphocytes Relative: 15 %
Lymphs Abs: 1.8 10*3/uL (ref 0.7–4.0)
MCH: 29.3 pg (ref 26.0–34.0)
MCHC: 32.9 g/dL (ref 30.0–36.0)
MCV: 89.1 fL (ref 80.0–100.0)
Monocytes Absolute: 0.8 10*3/uL (ref 0.1–1.0)
Monocytes Relative: 7 %
Neutro Abs: 8.8 10*3/uL — ABNORMAL HIGH (ref 1.7–7.7)
Neutrophils Relative %: 74 %
Platelets: 394 10*3/uL (ref 150–400)
RBC: 3.11 MIL/uL — ABNORMAL LOW (ref 3.87–5.11)
RDW: 15.1 % (ref 11.5–15.5)
WBC: 11.9 10*3/uL — ABNORMAL HIGH (ref 4.0–10.5)
nRBC: 0 % (ref 0.0–0.2)

## 2018-09-17 LAB — PROTIME-INR
INR: 1.1 (ref 0.8–1.2)
Prothrombin Time: 13.7 seconds (ref 11.4–15.2)

## 2018-09-17 LAB — HEPARIN LEVEL (UNFRACTIONATED)
Heparin Unfractionated: 0.33 IU/mL (ref 0.30–0.70)
Heparin Unfractionated: <0.1 IU/mL — ABNORMAL LOW (ref 0.30–0.70)

## 2018-09-17 NOTE — Progress Notes (Addendum)
Pulmonary Critical Care Medicine [facility] [place]   PULMONARY CRITICAL CARE SERVICE  PROGRESS NOTE  Date of Service: 09/17/2018  Monica Finley  LPF:[id number]  DOB: [birth year]   DOA: 09/03/2018  Referring Physician: Carron Curie, MD  HPI: Monica Finley is a 78 y.o. female seen for follow up of Acute on Chronic Respiratory Failure.  Patient continues on aerosol trach collar 28% FiO2 today for an 8-hour goal.  Currently satting well with no distress.  Medications: Reviewed on Rounds  Physical Exam:  Vitals: Pulse 80 respirations 18 BP 149/61 O2 sat 100% temp 98.7  Ventilator Settings ATC 28%  . General: Comfortable at this time . Eyes: Grossly normal lids, irises & conjunctiva . ENT: grossly tongue is normal . Neck: no obvious mass . Cardiovascular: S1 S2 normal no gallop . Respiratory: Coarse breath sounds . Abdomen: soft . Skin: no rash seen on limited exam . Musculoskeletal: not rigid . Psychiatric:unable to assess . Neurologic: no seizure no involuntary movements         Lab Data:   Basic Metabolic Panel: Recent Labs  Lab 09/12/18 0645  NA 131*  K 3.9  CL 92*  CO2 25  GLUCOSE 186*  BUN 22  CREATININE 0.71  CALCIUM 8.4*    ABG: No results for input(s): PHART, PCO2ART, PO2ART, HCO3, O2SAT in the last 168 hours.  Liver Function Tests: No results for input(s): AST, ALT, ALKPHOS, BILITOT, PROT, ALBUMIN in the last 168 hours. No results for input(s): LIPASE, AMYLASE in the last 168 hours. No results for input(s): AMMONIA in the last 168 hours.  CBC: Recent Labs  Lab 09/12/18 0645 09/13/18 0541 09/15/18 0451 09/17/18 0547  WBC 18.3* 15.5* 12.9* 11.9*  NEUTROABS  --   --  10.4* 8.8*  HGB 6.8* 9.6* 9.2* 9.1*  HCT 21.1* 29.1* 27.7* 27.7*  MCV 89.0 89.0 88.8 89.1  PLT 268 273 343 394    Cardiac Enzymes: No results for input(s): CKTOTAL, CKMB, CKMBINDEX, TROPONINI in the last 168 hours.  BNP (last 3 results) No results for  input(s): BNP in the last 8760 hours.  ProBNP (last 3 results) No results for input(s): PROBNP in the last 8760 hours.  Radiological Exams: No results found.  Assessment/Plan Active Problems:   Acute on chronic respiratory failure with hypoxia (HCC)   Ventilator dependent (HCC)   Cavitary pneumonia   Bilateral pulmonary embolism (HCC)   Severe sepsis (HCC)   Lobar pneumonia, unspecified organism (HCC)   1. Acute on chronic respiratory failure with hypoxia continue pressure support today and continue weaning per protocol.  Continue pulmonary toilet and secretion management as well as supportive measures. 2. Ventilator dependent continue supportive care and weaning as tolerated 3. Cavitary pneumonia treated continue to follow 4. Severe sepsis hemodynamically stable 5. Bilateral pulmonary emboli continue supportive measures 6. Lobar pneumonia treated   I have personally seen and evaluated the patient, evaluated laboratory and imaging results, formulated the assessment and plan and placed orders. The Patient requires high complexity decision making for assessment and support.  Case was discussed on Rounds with the Respiratory Therapy Staff  Yevonne Pax, MD [facility] Pulmonary Critical Care Medicine Sleep Medicine

## 2018-09-18 DIAGNOSIS — J9621 Acute and chronic respiratory failure with hypoxia: Secondary | ICD-10-CM | POA: Diagnosis not present

## 2018-09-18 DIAGNOSIS — J189 Pneumonia, unspecified organism: Secondary | ICD-10-CM | POA: Diagnosis not present

## 2018-09-18 DIAGNOSIS — I2699 Other pulmonary embolism without acute cor pulmonale: Secondary | ICD-10-CM | POA: Diagnosis not present

## 2018-09-18 DIAGNOSIS — J181 Lobar pneumonia, unspecified organism: Secondary | ICD-10-CM | POA: Diagnosis not present

## 2018-09-18 LAB — BASIC METABOLIC PANEL
Anion gap: 8 (ref 5–15)
BUN: 14 mg/dL (ref 8–23)
CO2: 25 mmol/L (ref 22–32)
Calcium: 7.9 mg/dL — ABNORMAL LOW (ref 8.9–10.3)
Chloride: 96 mmol/L — ABNORMAL LOW (ref 98–111)
Creatinine, Ser: 0.54 mg/dL (ref 0.44–1.00)
GFR calc Af Amer: >60 mL/min (ref >60)
GFR calc non Af Amer: >60 mL/min (ref >60)
Glucose, Bld: 160 mg/dL — ABNORMAL HIGH (ref 70–99)
Potassium: 3.3 mmol/L — ABNORMAL LOW (ref 3.5–5.1)
Sodium: 129 mmol/L — ABNORMAL LOW (ref 135–145)

## 2018-09-18 LAB — HEPARIN LEVEL (UNFRACTIONATED)
Heparin Unfractionated: 0.16 IU/mL — ABNORMAL LOW (ref 0.30–0.70)
Heparin Unfractionated: 0.18 IU/mL — ABNORMAL LOW (ref 0.30–0.70)
Heparin Unfractionated: 0.58 IU/mL (ref 0.30–0.70)

## 2018-09-18 NOTE — Progress Notes (Addendum)
Pulmonary Critical Care Medicine [facility] [place]   PULMONARY CRITICAL CARE SERVICE  PROGRESS NOTE  Date of Service: 09/18/2018  Monica Finley  [id number]  DOB: 09-Aug-1940   DOA: 09/03/2018  Referring Physician: Carron Curie, MD  HPI: Monica Finley is a 78 y.o. female seen for follow up of Acute on Chronic Respiratory Failure.  Patient is a 12-hour goal today on aerosol trach collar 28% FiO2.  Was able 8 hours yesterday with no difficulty.  Medications: Reviewed on Rounds  Physical Exam:  Vitals: Pulse 70 respirations 12 BP 162/71 O2 sat 100% temp 100.0  Ventilator Settings ATC 28%  . General: Comfortable at this time . Eyes: Grossly normal lids, irises & conjunctiva . ENT: grossly tongue is normal . Neck: no obvious mass . Cardiovascular: S1 S2 normal no gallop . Respiratory: Coarse breath sounds . Abdomen: soft . Skin: no rash seen on limited exam . Musculoskeletal: not rigid . Psychiatric:unable to assess . Neurologic: no seizure no involuntary movements         Lab Data:   Basic Metabolic Panel: Recent Labs  Lab 09/12/18 0645 09/18/18 1014  NA 131* 129*  K 3.9 3.3*  CL 92* 96*  CO2 25 25  GLUCOSE 186* 160*  BUN 22 14  CREATININE 0.71 0.54  CALCIUM 8.4* 7.9*    ABG: No results for input(s): PHART, PCO2ART, PO2ART, HCO3, O2SAT in the last 168 hours.  Liver Function Tests: No results for input(s): AST, ALT, ALKPHOS, BILITOT, PROT, ALBUMIN in the last 168 hours. No results for input(s): LIPASE, AMYLASE in the last 168 hours. No results for input(s): AMMONIA in the last 168 hours.  CBC: Recent Labs  Lab 09/12/18 0645 09/13/18 0541 09/15/18 0451 09/17/18 0547  WBC 18.3* 15.5* 12.9* 11.9*  NEUTROABS  --   --  10.4* 8.8*  HGB 6.8* 9.6* 9.2* 9.1*  HCT 21.1* 29.1* 27.7* 27.7*  MCV 89.0 89.0 88.8 89.1  PLT 268 273 343 394    Cardiac Enzymes: No results for input(s): CKTOTAL, CKMB, CKMBINDEX, TROPONINI in the last 168  hours.  BNP (last 3 results) No results for input(s): BNP in the last 8760 hours.  ProBNP (last 3 results) No results for input(s): PROBNP in the last 8760 hours.  Radiological Exams: No results found.  Assessment/Plan Active Problems:   Acute on chronic respiratory failure with hypoxia (HCC)   Ventilator dependent (HCC)   Cavitary pneumonia   Bilateral pulmonary embolism (HCC)   Severe sepsis (HCC)   Lobar pneumonia, unspecified organism (HCC)   1. Acute on chronic program with hypoxia continue with pressure support with a goal of 12 hours today.  Continue weaning over goal.  Continue supportive measures and secretion management as well as pulmonary toilet. 2. Ventilator dependent continue supportive care and wean as tolerated 3. Cavitary pneumonia treat continue to follow 4. Severe sepsis hemodynamically stable 5. Bilateral pulmonary emboli continue supportive measures 6. Lobar pneumonia treated   I have personally seen and evaluated the patient, evaluated laboratory and imaging results, formulated the assessment and plan and placed orders. The Patient requires high complexity decision making for assessment and support.  Case was discussed on Rounds with the Respiratory Therapy Staff  Yevonne Pax, MD [facility] Pulmonary Critical Care Medicine Sleep Medicine

## 2018-09-19 DIAGNOSIS — J189 Pneumonia, unspecified organism: Secondary | ICD-10-CM | POA: Diagnosis not present

## 2018-09-19 DIAGNOSIS — J9621 Acute and chronic respiratory failure with hypoxia: Secondary | ICD-10-CM | POA: Diagnosis not present

## 2018-09-19 DIAGNOSIS — J181 Lobar pneumonia, unspecified organism: Secondary | ICD-10-CM | POA: Diagnosis not present

## 2018-09-19 DIAGNOSIS — I2699 Other pulmonary embolism without acute cor pulmonale: Secondary | ICD-10-CM | POA: Diagnosis not present

## 2018-09-19 LAB — PREPARE RBC (CROSSMATCH)

## 2018-09-19 LAB — TYPE AND SCREEN
ABO/RH(D): O POS
Antibody Screen: NEGATIVE

## 2018-09-19 LAB — HEPARIN LEVEL (UNFRACTIONATED)
Heparin Unfractionated: 0.33 IU/mL (ref 0.30–0.70)
Heparin Unfractionated: 0.31 IU/mL (ref 0.30–0.70)

## 2018-09-19 NOTE — Progress Notes (Addendum)
Pulmonary Critical Care Medicine [facility] [place]   PULMONARY CRITICAL CARE SERVICE  PROGRESS NOTE  Date of Service: 09/19/2018  Monica Finley  [id number]  DOB: Sep 07, 1940   DOA: 09/03/2018  Referring Physician: Carron Curie, MD  HPI: Monica Finley is a 78 y.o. female seen for follow up of Acute on Chronic Respiratory Failure.  Patient remains on aerosol trach collar 28% FiO2 with a 16-hour goal today.  Satting well with no distress.  Medications: Reviewed on Rounds  Physical Exam:  Vitals: Pulse 82 respirations 15 BP 149/80 O2 sat 100% temp 99.1  Ventilator Settings ATC 28%  . General: Comfortable at this time . Eyes: Grossly normal lids, irises & conjunctiva . ENT: grossly tongue is normal . Neck: no obvious mass . Cardiovascular: S1 S2 normal no gallop . Respiratory: Coarse breath sounds . Abdomen: soft . Skin: no rash seen on limited exam . Musculoskeletal: not rigid . Psychiatric:unable to assess . Neurologic: no seizure no involuntary movements         Lab Data:   Basic Metabolic Panel: Recent Labs  Lab 09/18/18 1014  NA 129*  K 3.3*  CL 96*  CO2 25  GLUCOSE 160*  BUN 14  CREATININE 0.54  CALCIUM 7.9*    ABG: No results for input(s): PHART, PCO2ART, PO2ART, HCO3, O2SAT in the last 168 hours.  Liver Function Tests: No results for input(s): AST, ALT, ALKPHOS, BILITOT, PROT, ALBUMIN in the last 168 hours. No results for input(s): LIPASE, AMYLASE in the last 168 hours. No results for input(s): AMMONIA in the last 168 hours.  CBC: Recent Labs  Lab 09/13/18 0541 09/15/18 0451 09/17/18 0547  WBC 15.5* 12.9* 11.9*  NEUTROABS  --  10.4* 8.8*  HGB 9.6* 9.2* 9.1*  HCT 29.1* 27.7* 27.7*  MCV 89.0 88.8 89.1  PLT 273 343 394    Cardiac Enzymes: No results for input(s): CKTOTAL, CKMB, CKMBINDEX, TROPONINI in the last 168 hours.  BNP (last 3 results) No results for input(s): BNP in the last 8760 hours.  ProBNP (last 3  results) No results for input(s): PROBNP in the last 8760 hours.  Radiological Exams: No results found.  Assessment/Plan Active Problems:   Acute on chronic respiratory failure with hypoxia (HCC)   Ventilator dependent (HCC)   Cavitary pneumonia   Bilateral pulmonary embolism (HCC)   Severe sepsis (HCC)   Lobar pneumonia, unspecified organism (HCC)   1. Acute on chronic respiratory failure with hypoxia continue with pressure support with a goal of 16 hours today.  Continue supportive measures and pulmonary toilet 2. Ventilator dependent continue supportive care and wean as tolerated 3. Cavitary pneumonia treated continue to follow 4. Severe sepsis hemodynamically stable 5. Bilateral pulmonary emboli continue supportive measures 6. Lobar pneumonia treated   I have personally seen and evaluated the patient, evaluated laboratory and imaging results, formulated the assessment and plan and placed orders. The Patient requires high complexity decision making for assessment and support.  Case was discussed on Rounds with the Respiratory Therapy Staff  Yevonne Pax, MD [facility] Pulmonary Critical Care Medicine Sleep Medicine

## 2018-09-20 DIAGNOSIS — I2699 Other pulmonary embolism without acute cor pulmonale: Secondary | ICD-10-CM | POA: Diagnosis not present

## 2018-09-20 DIAGNOSIS — J189 Pneumonia, unspecified organism: Secondary | ICD-10-CM | POA: Diagnosis not present

## 2018-09-20 DIAGNOSIS — J181 Lobar pneumonia, unspecified organism: Secondary | ICD-10-CM | POA: Diagnosis not present

## 2018-09-20 DIAGNOSIS — J9621 Acute and chronic respiratory failure with hypoxia: Secondary | ICD-10-CM | POA: Diagnosis not present

## 2018-09-20 LAB — HEPARIN LEVEL (UNFRACTIONATED)
Heparin Unfractionated: 0.37 IU/mL (ref 0.30–0.70)
Heparin Unfractionated: 0.33 IU/mL (ref 0.30–0.70)
Heparin Unfractionated: 0.32 IU/mL (ref 0.30–0.70)

## 2018-09-20 NOTE — Progress Notes (Addendum)
Pulmonary Critical Care Medicine [facility] [place]   PULMONARY CRITICAL CARE SERVICE  PROGRESS NOTE  Date of Service: 09/20/2018  Monica Finley  HFG:[id number]  DOB: 05/06/1940   DOA: 09/03/2018  Referring Physician: Carron Curie, MD  HPI: Monica Finley is a 78 y.o. female seen for follow up of Acute on Chronic Respiratory Failure.  Patient continues on aerosol trach collar 28% today with a goal of 20 hours.  Currently satting well with no distress.  Medications: Reviewed on Rounds  Physical Exam:  Vitals: Pulse 82 respiration 17 BP 142/78 O2 sat 100% temp 97.7  Ventilator Settings 28% ATC  . General: Comfortable at this time . Eyes: Grossly normal lids, irises & conjunctiva . ENT: grossly tongue is normal . Neck: no obvious mass . Cardiovascular: S1 S2 normal no gallop . Respiratory: Coarse breath sounds . Abdomen: soft . Skin: no rash seen on limited exam . Musculoskeletal: not rigid . Psychiatric:unable to assess . Neurologic: no seizure no involuntary movements         Lab Data:   Basic Metabolic Panel: Recent Labs  Lab 09/18/18 1014  NA 129*  K 3.3*  CL 96*  CO2 25  GLUCOSE 160*  BUN 14  CREATININE 0.54  CALCIUM 7.9*    ABG: No results for input(s): PHART, PCO2ART, PO2ART, HCO3, O2SAT in the last 168 hours.  Liver Function Tests: No results for input(s): AST, ALT, ALKPHOS, BILITOT, PROT, ALBUMIN in the last 168 hours. No results for input(s): LIPASE, AMYLASE in the last 168 hours. No results for input(s): AMMONIA in the last 168 hours.  CBC: Recent Labs  Lab 09/15/18 0451 09/17/18 0547  WBC 12.9* 11.9*  NEUTROABS 10.4* 8.8*  HGB 9.2* 9.1*  HCT 27.7* 27.7*  MCV 88.8 89.1  PLT 343 394    Cardiac Enzymes: No results for input(s): CKTOTAL, CKMB, CKMBINDEX, TROPONINI in the last 168 hours.  BNP (last 3 results) No results for input(s): BNP in the last 8760 hours.  ProBNP (last 3 results) No results for input(s):  PROBNP in the last 8760 hours.  Radiological Exams: No results found.  Assessment/Plan Active Problems:   Acute on chronic respiratory failure with hypoxia (HCC)   Ventilator dependent (HCC)   Cavitary pneumonia   Bilateral pulmonary embolism (HCC)   Severe sepsis (HCC)   Lobar pneumonia, unspecified organism (HCC)   1. Acute on chronic respiratory failure with hypoxia continue aerosol trach collar 28% today for goal of 20 hours.  Continue pulmonary toilet supportive measures. 2. Ventilator dependent continue supportive care and wean as tolerated 3. Cavitary pneumonia treated continue to follow 4. Severe sepsis hemodynamically stable 5. Bilateral pulmonary emboli continue supportive measures 6. Lobar pneumonia treated   I have personally seen and evaluated the patient, evaluated laboratory and imaging results, formulated the assessment and plan and placed orders. The Patient requires high complexity decision making for assessment and support.  Case was discussed on Rounds with the Respiratory Therapy Staff  Yevonne Pax, MD [facility] Pulmonary Critical Care Medicine Sleep Medicine

## 2018-09-21 ENCOUNTER — Encounter (HOSPITAL_COMMUNITY): Payer: Self-pay | Admitting: Diagnostic Radiology

## 2018-09-21 ENCOUNTER — Other Ambulatory Visit (HOSPITAL_COMMUNITY): Payer: Medicare Other

## 2018-09-21 DIAGNOSIS — J9621 Acute and chronic respiratory failure with hypoxia: Secondary | ICD-10-CM | POA: Diagnosis not present

## 2018-09-21 DIAGNOSIS — J181 Lobar pneumonia, unspecified organism: Secondary | ICD-10-CM | POA: Diagnosis not present

## 2018-09-21 DIAGNOSIS — I2699 Other pulmonary embolism without acute cor pulmonale: Secondary | ICD-10-CM | POA: Diagnosis not present

## 2018-09-21 DIAGNOSIS — J189 Pneumonia, unspecified organism: Secondary | ICD-10-CM | POA: Diagnosis not present

## 2018-09-21 HISTORY — PX: IR FLUORO RM 30-60 MIN: IMG2384

## 2018-09-21 LAB — BASIC METABOLIC PANEL
Anion gap: 10 (ref 5–15)
BUN: 22 mg/dL (ref 8–23)
CO2: 27 mmol/L (ref 22–32)
Calcium: 8.6 mg/dL — ABNORMAL LOW (ref 8.9–10.3)
Chloride: 98 mmol/L (ref 98–111)
Creatinine, Ser: 0.6 mg/dL (ref 0.44–1.00)
GFR calc Af Amer: >60 mL/min (ref >60)
GFR calc non Af Amer: >60 mL/min (ref >60)
Glucose, Bld: 134 mg/dL — ABNORMAL HIGH (ref 70–99)
Potassium: 4.1 mmol/L (ref 3.5–5.1)
Sodium: 135 mmol/L (ref 135–145)

## 2018-09-21 LAB — CBC
HCT: 28.3 % — ABNORMAL LOW (ref 36.0–46.0)
Hemoglobin: 9 g/dL — ABNORMAL LOW (ref 12.0–15.0)
MCH: 29.2 pg (ref 26.0–34.0)
MCHC: 31.8 g/dL (ref 30.0–36.0)
MCV: 91.9 fL (ref 80.0–100.0)
Platelets: 429 10*3/uL — ABNORMAL HIGH (ref 150–400)
RBC: 3.08 MIL/uL — ABNORMAL LOW (ref 3.87–5.11)
RDW: 15.1 % (ref 11.5–15.5)
WBC: 8.8 10*3/uL (ref 4.0–10.5)
nRBC: 0 % (ref 0.0–0.2)

## 2018-09-21 LAB — HEPARIN LEVEL (UNFRACTIONATED): Heparin Unfractionated: 0.22 IU/mL — ABNORMAL LOW (ref 0.30–0.70)

## 2018-09-21 MED ORDER — CEFAZOLIN SODIUM-DEXTROSE 2-4 GM/100ML-% IV SOLN
INTRAVENOUS | Status: AC
  Filled 2018-09-21: qty 100

## 2018-09-21 MED ORDER — LIDOCAINE HCL 1 % IJ SOLN
INTRAMUSCULAR | Status: AC
  Filled 2018-09-21: qty 20

## 2018-09-21 MED ORDER — CEFAZOLIN SODIUM-DEXTROSE 1-4 GM/50ML-% IV SOLN
INTRAVENOUS | Status: AC | PRN
  Administered 2018-09-21: 2 g via INTRAVENOUS

## 2018-09-21 MED ORDER — MIDAZOLAM HCL 2 MG/2ML IJ SOLN
INTRAMUSCULAR | Status: AC
  Filled 2018-09-21: qty 2

## 2018-09-21 MED ORDER — LIDOCAINE VISCOUS HCL 2 % MT SOLN
OROMUCOSAL | Status: AC
  Filled 2018-09-21: qty 15

## 2018-09-21 MED ORDER — FENTANYL CITRATE (PF) 100 MCG/2ML IJ SOLN
INTRAMUSCULAR | Status: AC
  Filled 2018-09-21: qty 2

## 2018-09-21 MED ORDER — GLUCAGON HCL RDNA (DIAGNOSTIC) 1 MG IJ SOLR
INTRAMUSCULAR | Status: AC
  Filled 2018-09-21: qty 1

## 2018-09-21 NOTE — Progress Notes (Signed)
Patient ID: Monica Finley, female   DOB: [birth year], 78 y.o.   MRN: [id number] Scheduled for percutaneous gastrostomy tube.  Colon location is uncertain and patient could not tolerate a barium enema, attempted twice.  Put barium thru NGT and will get KUB tomorrow.  When colon can be visualized, will perform gastrostomy tube, hopefully tomorrow.

## 2018-09-21 NOTE — Sedation Documentation (Addendum)
Dr. Lowella Dandy attempted a barium enema, unable to proceed.  Pt to receive contrast in NG tube, per IR staff.  Case has been cancelled for today.  Notified Dr. Lowella Dandy that patient is receiving 2gm of ancef for procedure, per Dr Lowella Dandy, OK to finish the ancef.

## 2018-09-21 NOTE — Progress Notes (Addendum)
Pulmonary Critical Care Medicine [facility] [place]   PULMONARY CRITICAL CARE SERVICE  PROGRESS NOTE  Date of Service: 09/21/2018  Monica Finley  CBU:[id number]  DOB: [birth year]   DOA: 09/03/2018  Referring Physician: Carron Curie, MD  HPI: Monica Finley is a 78 y.o. female seen for follow up of Acute on Chronic Respiratory Failure.  Patient has a 24-hour goal on aerosol trach collar 20% FiO2.  Currently satting well with no distress.  Medications: Reviewed on Rounds  Physical Exam:  Vitals: Pulse 92 respirations 20 BP 131/69 O2 sat 100% temp 98.8  Ventilator Settings ATC 28%  . General: Comfortable at this time . Eyes: Grossly normal lids, irises & conjunctiva . ENT: grossly tongue is normal . Neck: no obvious mass . Cardiovascular: S1 S2 normal no gallop . Respiratory: Coarse breath sounds . Abdomen: soft . Skin: no rash seen on limited exam . Musculoskeletal: not rigid . Psychiatric:unable to assess . Neurologic: no seizure no involuntary movements         Lab Data:   Basic Metabolic Panel: Recent Labs  Lab 09/18/18 1014 09/21/18 0236  NA 129* 135  K 3.3* 4.1  CL 96* 98  CO2 25 27  GLUCOSE 160* 134*  BUN 14 22  CREATININE 0.54 0.60  CALCIUM 7.9* 8.6*    ABG: No results for input(s): PHART, PCO2ART, PO2ART, HCO3, O2SAT in the last 168 hours.  Liver Function Tests: No results for input(s): AST, ALT, ALKPHOS, BILITOT, PROT, ALBUMIN in the last 168 hours. No results for input(s): LIPASE, AMYLASE in the last 168 hours. No results for input(s): AMMONIA in the last 168 hours.  CBC: Recent Labs  Lab 09/15/18 0451 09/17/18 0547 09/21/18 0236  WBC 12.9* 11.9* 8.8  NEUTROABS 10.4* 8.8*  --   HGB 9.2* 9.1* 9.0*  HCT 27.7* 27.7* 28.3*  MCV 88.8 89.1 91.9  PLT 343 394 429*    Cardiac Enzymes: No results for input(s): CKTOTAL, CKMB, CKMBINDEX, TROPONINI in the last 168 hours.  BNP (last 3 results) No results for input(s): BNP  in the last 8760 hours.  ProBNP (last 3 results) No results for input(s): PROBNP in the last 8760 hours.  Radiological Exams: Ir Fluoro Rm 30-60 Min  Result Date: 09/21/2018 CLINICAL DATA:  78 year old with respiratory failure and dysphagia. Scheduled for percutaneous gastrostomy tube placement. EXAM: IR FLUORO RM 0-60 MIN ANESTHESIA/SEDATION: None FLUOROSCOPY TIME:  1 minutes and 48 seconds, 23 mGy MEDICATIONS: None CONTRAST:  Barium PROCEDURE: Patient was placed supine on interventional table. Fluoroscopic images were obtained. The splenic flexure of the colon is noted to be high in the left abdomen. Transverse colon was not clearly identified with gas. Therefore, attempted barium enema. Patient was placed on her side and a rectal tube was placed with viscous lidocaine. Despite inflating a retention balloon, the barium repeatedly came out of the rectum and unable to opacify the transverse colon. Therefore, approximately 120 mL of barium was injected through the nasogastric tube. Tube was then flushed with saline. COMPLICATIONS: None immediate FINDINGS: Transverse colon was not confidently identified with fluoroscopy. Unable to opacify the colon with a barium enema. Barium placed in the stomach. IMPRESSION: Transverse colon was not confidently identify with fluoroscopy. Attempted barium enema was unsuccessful. Therefore, barium was injected through the nasogastric tube. Will get an abdominal radiograph on 09/22/2018 in order to hopefully evaluate the location of the transverse colon. When the transverse colon can be safely identified, will attempt gastrostomy tube placement. Electronically Signed  By: Richarda OverlieAdam  Henn M.D.   On: 09/21/2018 13:55    Assessment/Plan Active Problems:   Acute on chronic respiratory failure with hypoxia (HCC)   Ventilator dependent (HCC)   Cavitary pneumonia   Bilateral pulmonary embolism (HCC)   Severe sepsis (HCC)   Lobar pneumonia, unspecified organism  (HCC)   1. Acute on chronic respiratory failure with hypoxia continue aerosol trach collar 28% today for goal of 24 hours.  Continue pulmonary toilet supportive measures. 2. Ventilator dependent continue supportive care and wean as tolerated 3. Cavitary pneumonia treated continue to follow 4. Severe sepsis hemodynamically stable 5. Bilateral pulmonary emboli continue supportive measures 6. Lobar pneumonia treated   I have personally seen and evaluated the patient, evaluated laboratory and imaging results, formulated the assessment and plan and placed orders. The Patient requires high complexity decision making for assessment and support.  Case was discussed on Rounds with the Respiratory Therapy Staff  Yevonne PaxSaadat A , MD [facility] Pulmonary Critical Care Medicine Sleep Medicine

## 2018-09-21 NOTE — Sedation Documentation (Signed)
Pt transported back to 5E-02.  Pt care transferred to [facility] and report given to [facility].  Case done

## 2018-09-22 ENCOUNTER — Other Ambulatory Visit (HOSPITAL_COMMUNITY): Payer: Medicare Other

## 2018-09-22 ENCOUNTER — Encounter (HOSPITAL_COMMUNITY): Payer: Self-pay | Admitting: Diagnostic Radiology

## 2018-09-22 DIAGNOSIS — J181 Lobar pneumonia, unspecified organism: Secondary | ICD-10-CM | POA: Diagnosis not present

## 2018-09-22 DIAGNOSIS — J9621 Acute and chronic respiratory failure with hypoxia: Secondary | ICD-10-CM | POA: Diagnosis not present

## 2018-09-22 DIAGNOSIS — J189 Pneumonia, unspecified organism: Secondary | ICD-10-CM | POA: Diagnosis not present

## 2018-09-22 DIAGNOSIS — I2699 Other pulmonary embolism without acute cor pulmonale: Secondary | ICD-10-CM | POA: Diagnosis not present

## 2018-09-22 HISTORY — PX: IR GASTROSTOMY TUBE MOD SED: IMG625

## 2018-09-22 MED ORDER — CEFAZOLIN SODIUM-DEXTROSE 2-4 GM/100ML-% IV SOLN
INTRAVENOUS | Status: AC
  Filled 2018-09-22: qty 100

## 2018-09-22 MED ORDER — MIDAZOLAM HCL 2 MG/2ML IJ SOLN
INTRAMUSCULAR | Status: AC | PRN
  Administered 2018-09-22: 1 mg via INTRAVENOUS

## 2018-09-22 MED ORDER — LIDOCAINE HCL 1 % IJ SOLN
INTRAMUSCULAR | Status: AC
  Filled 2018-09-22: qty 20

## 2018-09-22 MED ORDER — FENTANYL CITRATE (PF) 100 MCG/2ML IJ SOLN
INTRAMUSCULAR | Status: AC
  Filled 2018-09-22: qty 2

## 2018-09-22 MED ORDER — GLUCAGON HCL (RDNA) 1 MG IJ SOLR
INTRAMUSCULAR | Status: AC | PRN
  Administered 2018-09-22: 1 mg via INTRAVENOUS

## 2018-09-22 MED ORDER — IOHEXOL 300 MG/ML  SOLN
50.0000 mL | Freq: Once | INTRAMUSCULAR | Status: AC | PRN
  Administered 2018-09-22: 20 mL

## 2018-09-22 MED ORDER — MIDAZOLAM HCL 2 MG/2ML IJ SOLN
INTRAMUSCULAR | Status: AC
  Filled 2018-09-22: qty 2

## 2018-09-22 MED ORDER — FENTANYL CITRATE (PF) 100 MCG/2ML IJ SOLN
INTRAMUSCULAR | Status: AC | PRN
  Administered 2018-09-22 (×2): 25 ug via INTRAVENOUS

## 2018-09-22 MED ORDER — GLUCAGON HCL RDNA (DIAGNOSTIC) 1 MG IJ SOLR
INTRAMUSCULAR | Status: AC
  Filled 2018-09-22: qty 1

## 2018-09-22 MED ORDER — CEFAZOLIN SODIUM-DEXTROSE 1-4 GM/50ML-% IV SOLN
INTRAVENOUS | Status: AC | PRN
  Administered 2018-09-22: 2 g via INTRAVENOUS

## 2018-09-22 NOTE — Sedation Documentation (Signed)
UTA EtCO2, pt is on trach mask and cannot monitor EtCO2 atthis time

## 2018-09-22 NOTE — Procedures (Signed)
Interventional Radiology Procedure:   Indications: Respiratory failure and dysphagia  Procedure: Gastrostomy tube placement  Findings: 20 Fr tube in stomach  Complications: None     EBL: less than 20 ml  Plan: Keep NPO today.  May use tube for medications today.      Timon Geissinger R. Lowella Dandy, MD  Pager: [phone]

## 2018-09-22 NOTE — Progress Notes (Addendum)
Pulmonary Critical Care Medicine [facility] [place]   PULMONARY CRITICAL CARE SERVICE  PROGRESS NOTE  Date of Service: 09/22/2018  Monica Finley  VFI:[id number]  DOB: August 14, 1940   DOA: 09/03/2018  Referring Physician: Carron Curie, MD  HPI: Monica Finley is a 78 y.o. female seen for follow up of Acute on Chronic Respiratory Failure.  Patient has a 48-hour goal currently on aerosol trach collar 20% FiO2.  Satting well with no distress at this time.  Medications: Reviewed on Rounds  Physical Exam:  Vitals: Pulse 77 respirations 12 BP 166/68 O2 sat 100% temp 97.4  Ventilator Settings ATC 28%  . General: Comfortable at this time . Eyes: Grossly normal lids, irises & conjunctiva . ENT: grossly tongue is normal . Neck: no obvious mass . Cardiovascular: S1 S2 normal no gallop . Respiratory: Coarse breath sounds . Abdomen: soft . Skin: no rash seen on limited exam . Musculoskeletal: not rigid . Psychiatric:unable to assess . Neurologic: no seizure no involuntary movements         Lab Data:   Basic Metabolic Panel: Recent Labs  Lab 09/18/18 1014 09/21/18 0236  NA 129* 135  K 3.3* 4.1  CL 96* 98  CO2 25 27  GLUCOSE 160* 134*  BUN 14 22  CREATININE 0.54 0.60  CALCIUM 7.9* 8.6*    ABG: No results for input(s): PHART, PCO2ART, PO2ART, HCO3, O2SAT in the last 168 hours.  Liver Function Tests: No results for input(s): AST, ALT, ALKPHOS, BILITOT, PROT, ALBUMIN in the last 168 hours. No results for input(s): LIPASE, AMYLASE in the last 168 hours. No results for input(s): AMMONIA in the last 168 hours.  CBC: Recent Labs  Lab 09/17/18 0547 09/21/18 0236  WBC 11.9* 8.8  NEUTROABS 8.8*  --   HGB 9.1* 9.0*  HCT 27.7* 28.3*  MCV 89.1 91.9  PLT 394 429*    Cardiac Enzymes: No results for input(s): CKTOTAL, CKMB, CKMBINDEX, TROPONINI in the last 168 hours.  BNP (last 3 results) No results for input(s): BNP in the last 8760 hours.  ProBNP  (last 3 results) No results for input(s): PROBNP in the last 8760 hours.  Radiological Exams: Dg Abd 1 View  Result Date: 09/22/2018 CLINICAL DATA:  Preop evaluation for percutaneous gastrostomy tube placement. Identify location of transverse colon. EXAM: ABDOMEN - 1 VIEW COMPARISON:  One-view abdomen 09/14/2018 FINDINGS: Contrast opacifies the colon. The splenic flexure extends to just below the hemidiaphragm. The transverse colon extends inferiorly to the pelvis. NG tube outlines the course of the stomach. IMPRESSION: 1. Position of transverse colon well identified with inferior extension to the level of pelvis. 2. Splenic flexure extends nearly to the diaphragm. Electronically Signed   By: Marin Roberts M.D.   On: 09/22/2018 07:25   Ir Gastrostomy Tube Mod Sed  Result Date: 09/22/2018 INDICATION: 78 year old with respiratory failure and dysphagia. Request for gastrostomy tube placement. Barium was placed through the nasogastric tube yesterday in order to opacify the colon for today's procedure. EXAM: PERCUTANEOUS GASTROSTOMY TUBE WITH FLUOROSCOPIC GUIDANCE Physician: Rachelle Hora. Lowella Dandy, MD MEDICATIONS: Ancef 2 g; Antibiotics were administered within 1 hour of the procedure. Glucagon 1 mg IV ANESTHESIA/SEDATION: Versed 1.0 mg IV; Fentanyl 50 mcg IV Moderate Sedation Time:  12 minutes The patient was continuously monitored during the procedure by the interventional radiology nurse under my direct supervision. FLUOROSCOPY TIME:  Fluoroscopy Time: 2 minutes, 6 seconds, 8 mGy COMPLICATIONS: None immediate. PROCEDURE: Informed consent was obtained for a percutaneous gastrostomy tube.  The patient was placed on the interventional table. Fluoroscopy demonstrated oral contrast in the transverse colon. An orogastric tube was placed with fluoroscopic guidance. The anterior abdomen was prepped and draped in sterile fashion. Maximal barrier sterile technique was utilized including caps, mask, sterile gowns, sterile  gloves, sterile drape, hand hygiene and skin antiseptic. Stomach was inflated with air through the orogastric tube. The skin and subcutaneous tissues were anesthetized with 1% lidocaine. A 17 gauge needle was directed into the distended stomach with fluoroscopic guidance. A wire was advanced into the stomach and a T-tact was deployed. A 9-French vascular sheath was placed and the orogastric tube was snared using a [employer] snare device. The orogastric tube and snare were pulled out of the patient's mouth. The snare device was connected to a 20-French gastrostomy tube. The snare device and gastrostomy tube were pulled through the patient's mouth and out the anterior abdominal wall. The gastrostomy tube was cut to an appropriate length. Contrast injection through gastrostomy tube confirmed placement within the stomach. Fluoroscopic images were obtained for documentation. The gastrostomy tube was flushed with normal saline. IMPRESSION: Successful fluoroscopic guided percutaneous gastrostomy tube placement. Electronically Signed   By: Richarda OverlieAdam  Henn M.D.   On: 09/22/2018 15:01   Ir Fluoro Rm 30-60 Min  Result Date: 09/21/2018 CLINICAL DATA:  78 year old with respiratory failure and dysphagia. Scheduled for percutaneous gastrostomy tube placement. EXAM: IR FLUORO RM 0-60 MIN ANESTHESIA/SEDATION: None FLUOROSCOPY TIME:  1 minutes and 48 seconds, 23 mGy MEDICATIONS: None CONTRAST:  Barium PROCEDURE: Patient was placed supine on interventional table. Fluoroscopic images were obtained. The splenic flexure of the colon is noted to be high in the left abdomen. Transverse colon was not clearly identified with gas. Therefore, attempted barium enema. Patient was placed on her side and a rectal tube was placed with viscous lidocaine. Despite inflating a retention balloon, the barium repeatedly came out of the rectum and unable to opacify the transverse colon. Therefore, approximately 120 mL of barium was injected through the  nasogastric tube. Tube was then flushed with saline. COMPLICATIONS: None immediate FINDINGS: Transverse colon was not confidently identified with fluoroscopy. Unable to opacify the colon with a barium enema. Barium placed in the stomach. IMPRESSION: Transverse colon was not confidently identify with fluoroscopy. Attempted barium enema was unsuccessful. Therefore, barium was injected through the nasogastric tube. Will get an abdominal radiograph on 09/22/2018 in order to hopefully evaluate the location of the transverse colon. When the transverse colon can be safely identified, will attempt gastrostomy tube placement. Electronically Signed   By: Richarda OverlieAdam  Henn M.D.   On: 09/21/2018 13:55    Assessment/Plan Active Problems:   Acute on chronic respiratory failure with hypoxia (HCC)   Ventilator dependent (HCC)   Cavitary pneumonia   Bilateral pulmonary embolism (HCC)   Severe sepsis (HCC)   Lobar pneumonia, unspecified organism (HCC)   1. Acute on chronic respiratory failure with hypoxia continue aerosol trach collar 20% FiO2 for goal of 48 hours.  Continue supportive measures and pulmonary toilet 2. Ventilator dependent, currently wean to trach collar.  Continue supportive care and wean as tolerated 3. Cavitary pneumonia treated continue to follow 4. Severe sepsis hemodynamically stable 5. Bilateral pulmonary emboli continue supportive measures 6. Lobar pneumonia treated   I have personally seen and evaluated the patient, evaluated laboratory and imaging results, formulated the assessment and plan and placed orders. The Patient requires high complexity decision making for assessment and support.  Case was discussed on Rounds with the Respiratory Therapy  Staff  Allyne Gee, MD [facility] Pulmonary Critical Care Medicine Sleep Medicine

## 2018-09-23 DIAGNOSIS — I2699 Other pulmonary embolism without acute cor pulmonale: Secondary | ICD-10-CM | POA: Diagnosis not present

## 2018-09-23 DIAGNOSIS — J9621 Acute and chronic respiratory failure with hypoxia: Secondary | ICD-10-CM | POA: Diagnosis not present

## 2018-09-23 DIAGNOSIS — J189 Pneumonia, unspecified organism: Secondary | ICD-10-CM | POA: Diagnosis not present

## 2018-09-23 DIAGNOSIS — J181 Lobar pneumonia, unspecified organism: Secondary | ICD-10-CM | POA: Diagnosis not present

## 2018-09-23 LAB — PROTIME-INR
INR: 0.9 (ref 0.8–1.2)
Prothrombin Time: 11.8 seconds (ref 11.4–15.2)

## 2018-09-23 LAB — CBC WITH DIFFERENTIAL/PLATELET
Abs Immature Granulocytes: 0.08 10*3/uL — ABNORMAL HIGH (ref 0.00–0.07)
Basophils Absolute: 0 10*3/uL (ref 0.0–0.1)
Basophils Relative: 0 %
Eosinophils Absolute: 0.2 10*3/uL (ref 0.0–0.5)
Eosinophils Relative: 2 %
HCT: 30 % — ABNORMAL LOW (ref 36.0–46.0)
Hemoglobin: 10.1 g/dL — ABNORMAL LOW (ref 12.0–15.0)
Immature Granulocytes: 1 %
Lymphocytes Relative: 21 %
Lymphs Abs: 2 10*3/uL (ref 0.7–4.0)
MCH: 30.4 pg (ref 26.0–34.0)
MCHC: 33.7 g/dL (ref 30.0–36.0)
MCV: 90.4 fL (ref 80.0–100.0)
Monocytes Absolute: 0.7 10*3/uL (ref 0.1–1.0)
Monocytes Relative: 7 %
Neutro Abs: 6.8 10*3/uL (ref 1.7–7.7)
Neutrophils Relative %: 69 %
Platelets: 442 10*3/uL — ABNORMAL HIGH (ref 150–400)
RBC: 3.32 MIL/uL — ABNORMAL LOW (ref 3.87–5.11)
RDW: 14.8 % (ref 11.5–15.5)
WBC: 9.8 10*3/uL (ref 4.0–10.5)
nRBC: 0 % (ref 0.0–0.2)

## 2018-09-23 LAB — HEPARIN LEVEL (UNFRACTIONATED)
Heparin Unfractionated: <0.1 IU/mL — ABNORMAL LOW (ref 0.30–0.70)
Heparin Unfractionated: <0.1 IU/mL — ABNORMAL LOW (ref 0.30–0.70)

## 2018-09-23 LAB — APTT: aPTT: 30 seconds (ref 24–36)

## 2018-09-23 NOTE — Progress Notes (Addendum)
Referring Physician(s): Carron Curie  Supervising Physician: Ruel Favors  Patient Status:  [facility] - In-pt  Chief Complaint: None  Subjective:  Dysphagia s/p percutaneous gastrostomy tube placement 09/22/2018 by Dr. Lowella Dandy. Patient awake laying in bed. History difficult to obtain due to tracheostomy. Gastrostomy tube site c/d/i.   Allergies: Patient has no known allergies.  Medications: Prior to Admission medications   Not on File     Vital Signs: BP (!) 157/57 (BP Location: Left Arm)   Pulse 84   Resp 20   Ht 5' 2.99" (1.6 m) Comment: 08/20/18  Wt 139 lb 15.9 oz (63.5 kg) Comment: 08/26/18  SpO2 98%   BMI 24.80 kg/m   Physical Exam Vitals signs reviewed.  Constitutional:      General: She is not in acute distress.    Appearance: Normal appearance.     Comments: Tracheostomy.  Pulmonary:     Effort: Pulmonary effort is normal. No respiratory distress.     Comments: Tracheostomy. Abdominal:     Palpations: Abdomen is soft.     Comments: Gastrostomy tube site without tenderness, erythema, bleeding, or drainage.  Skin:    General: Skin is warm and dry.  Neurological:     Mental Status: She is alert.     Imaging: Dg Abd 1 View  Result Date: 09/22/2018 CLINICAL DATA:  Preop evaluation for percutaneous gastrostomy tube placement. Identify location of transverse colon. EXAM: ABDOMEN - 1 VIEW COMPARISON:  One-view abdomen 09/14/2018 FINDINGS: Contrast opacifies the colon. The splenic flexure extends to just below the hemidiaphragm. The transverse colon extends inferiorly to the pelvis. NG tube outlines the course of the stomach. IMPRESSION: 1. Position of transverse colon well identified with inferior extension to the level of pelvis. 2. Splenic flexure extends nearly to the diaphragm. Electronically Signed   By: Marin Roberts M.D.   On: 09/22/2018 07:25   Ir Gastrostomy Tube Mod Sed  Result Date: 09/22/2018 INDICATION: 78 year old with respiratory failure  and dysphagia. Request for gastrostomy tube placement. Barium was placed through the nasogastric tube yesterday in order to opacify the colon for today's procedure. EXAM: PERCUTANEOUS GASTROSTOMY TUBE WITH FLUOROSCOPIC GUIDANCE Physician: Rachelle Hora. Lowella Dandy, MD MEDICATIONS: Ancef 2 g; Antibiotics were administered within 1 hour of the procedure. Glucagon 1 mg IV ANESTHESIA/SEDATION: Versed 1.0 mg IV; Fentanyl 50 mcg IV Moderate Sedation Time:  12 minutes The patient was continuously monitored during the procedure by the interventional radiology nurse under my direct supervision. FLUOROSCOPY TIME:  Fluoroscopy Time: 2 minutes, 6 seconds, 8 mGy COMPLICATIONS: None immediate. PROCEDURE: Informed consent was obtained for a percutaneous gastrostomy tube. The patient was placed on the interventional table. Fluoroscopy demonstrated oral contrast in the transverse colon. An orogastric tube was placed with fluoroscopic guidance. The anterior abdomen was prepped and draped in sterile fashion. Maximal barrier sterile technique was utilized including caps, mask, sterile gowns, sterile gloves, sterile drape, hand hygiene and skin antiseptic. Stomach was inflated with air through the orogastric tube. The skin and subcutaneous tissues were anesthetized with 1% lidocaine. A 17 gauge needle was directed into the distended stomach with fluoroscopic guidance. A wire was advanced into the stomach and a T-tact was deployed. A 9-French vascular sheath was placed and the orogastric tube was snared using a Gooseneck snare device. The orogastric tube and snare were pulled out of the patient's mouth. The snare device was connected to a 20-French gastrostomy tube. The snare device and gastrostomy tube were pulled through the patient's mouth and out the  anterior abdominal wall. The gastrostomy tube was cut to an appropriate length. Contrast injection through gastrostomy tube confirmed placement within the stomach. Fluoroscopic images were obtained  for documentation. The gastrostomy tube was flushed with normal saline. IMPRESSION: Successful fluoroscopic guided percutaneous gastrostomy tube placement. Electronically Signed   By: Richarda OverlieAdam  Henn M.D.   On: 09/22/2018 15:01   Ir Fluoro Rm 30-60 Min  Result Date: 09/21/2018 CLINICAL DATA:  78 year old with respiratory failure and dysphagia. Scheduled for percutaneous gastrostomy tube placement. EXAM: IR FLUORO RM 0-60 MIN ANESTHESIA/SEDATION: None FLUOROSCOPY TIME:  1 minutes and 48 seconds, 23 mGy MEDICATIONS: None CONTRAST:  Barium PROCEDURE: Patient was placed supine on interventional table. Fluoroscopic images were obtained. The splenic flexure of the colon is noted to be high in the left abdomen. Transverse colon was not clearly identified with gas. Therefore, attempted barium enema. Patient was placed on her side and a rectal tube was placed with viscous lidocaine. Despite inflating a retention balloon, the barium repeatedly came out of the rectum and unable to opacify the transverse colon. Therefore, approximately 120 mL of barium was injected through the nasogastric tube. Tube was then flushed with saline. COMPLICATIONS: None immediate FINDINGS: Transverse colon was not confidently identified with fluoroscopy. Unable to opacify the colon with a barium enema. Barium placed in the stomach. IMPRESSION: Transverse colon was not confidently identify with fluoroscopy. Attempted barium enema was unsuccessful. Therefore, barium was injected through the nasogastric tube. Will get an abdominal radiograph on 09/22/2018 in order to hopefully evaluate the location of the transverse colon. When the transverse colon can be safely identified, will attempt gastrostomy tube placement. Electronically Signed   By: Richarda OverlieAdam  Henn M.D.   On: 09/21/2018 13:55    Labs:  CBC: Recent Labs    09/13/18 0541 09/15/18 0451 09/17/18 0547 09/21/18 0236  WBC 15.5* 12.9* 11.9* 8.8  HGB 9.6* 9.2* 9.1* 9.0*  HCT 29.1* 27.7* 27.7*  28.3*  PLT 273 343 394 429*    COAGS: Recent Labs    09/03/18 1811 09/17/18 0547  INR 2.7* 1.1    BMP: Recent Labs    09/08/18 0549 09/12/18 0645 09/18/18 1014 09/21/18 0236  NA 137 131* 129* 135  K 4.5 3.9 3.3* 4.1  CL 99 92* 96* 98  CO2 28 25 25 27   GLUCOSE 182* 186* 160* 134*  BUN 33* 22 14 22   CALCIUM 8.5* 8.4* 7.9* 8.6*  CREATININE 0.78 0.71 0.54 0.60  GFRNONAA >60 >60 >60 >60  GFRAA >60 >60 >60 >60    LIVER FUNCTION TESTS: Recent Labs    09/03/18 1811  BILITOT 0.9  AST 26  ALT 37  ALKPHOS 123  PROT 6.5  ALBUMIN 2.6*    Assessment and Plan:  Dysphagia s/p percutaneous gastrostomy tube placement 09/22/2018 by Dr. Lowella DandyHenn. Gastrostomy tube site stable- gastrostomy tube ready for use. Please call IR with questions/concerns.   Electronically Signed: Elwin MochaAlexandra Rahul Malinak, PA-C 09/23/2018, 10:24 AM   I spent a total of 25 Minutes at the the patient's bedside AND on the patient's hospital floor or unit, greater than 50% of which was counseling/coordinating care for dysphagia s/p gastrostomy tube placement.

## 2018-09-23 NOTE — Progress Notes (Signed)
Pulmonary Critical Care Medicine [facility] [place]   PULMONARY CRITICAL CARE SERVICE  PROGRESS NOTE  Date of Service: 09/23/2018  Monica Finley  WVP:[id number]  DOB: 21-Aug-1940   DOA: 09/03/2018  Referring Physician: Carron Curie, MD  HPI: Monica Finley is a 78 y.o. female seen for follow up of Acute on Chronic Respiratory Failure.  Patient is on T collar has been on 28% FiO2 goal today is for 48 hours  Medications: Reviewed on Rounds  Physical Exam:  Vitals: Temperature 97.5 pulse 74 respiratory 10 blood pressure 111/59 saturations 100%  Ventilator Settings off the ventilator right now on T collar  . General: Comfortable at this time . Eyes: Grossly normal lids, irises & conjunctiva . ENT: grossly tongue is normal . Neck: no obvious mass . Cardiovascular: S1 S2 normal no gallop . Respiratory: No rhonchi no rales are noted at this time . Abdomen: soft . Skin: no rash seen on limited exam . Musculoskeletal: not rigid . Psychiatric:unable to assess . Neurologic: no seizure no involuntary movements         Lab Data:   Basic Metabolic Panel: Recent Labs  Lab 09/18/18 1014 09/21/18 0236  NA 129* 135  K 3.3* 4.1  CL 96* 98  CO2 25 27  GLUCOSE 160* 134*  BUN 14 22  CREATININE 0.54 0.60  CALCIUM 7.9* 8.6*    ABG: No results for input(s): PHART, PCO2ART, PO2ART, HCO3, O2SAT in the last 168 hours.  Liver Function Tests: No results for input(s): AST, ALT, ALKPHOS, BILITOT, PROT, ALBUMIN in the last 168 hours. No results for input(s): LIPASE, AMYLASE in the last 168 hours. No results for input(s): AMMONIA in the last 168 hours.  CBC: Recent Labs  Lab 09/17/18 0547 09/21/18 0236  WBC 11.9* 8.8  NEUTROABS 8.8*  --   HGB 9.1* 9.0*  HCT 27.7* 28.3*  MCV 89.1 91.9  PLT 394 429*    Cardiac Enzymes: No results for input(s): CKTOTAL, CKMB, CKMBINDEX, TROPONINI in the last 168 hours.  BNP (last 3 results) No results for input(s): BNP  in the last 8760 hours.  ProBNP (last 3 results) No results for input(s): PROBNP in the last 8760 hours.  Radiological Exams: Dg Abd 1 View  Result Date: 09/22/2018 CLINICAL DATA:  Preop evaluation for percutaneous gastrostomy tube placement. Identify location of transverse colon. EXAM: ABDOMEN - 1 VIEW COMPARISON:  One-view abdomen 09/14/2018 FINDINGS: Contrast opacifies the colon. The splenic flexure extends to just below the hemidiaphragm. The transverse colon extends inferiorly to the pelvis. NG tube outlines the course of the stomach. IMPRESSION: 1. Position of transverse colon well identified with inferior extension to the level of pelvis. 2. Splenic flexure extends nearly to the diaphragm. Electronically Signed   By: Marin Roberts M.D.   On: 09/22/2018 07:25   Ir Gastrostomy Tube Mod Sed  Result Date: 09/22/2018 INDICATION: 78 year old with respiratory failure and dysphagia. Request for gastrostomy tube placement. Barium was placed through the nasogastric tube yesterday in order to opacify the colon for today's procedure. EXAM: PERCUTANEOUS GASTROSTOMY TUBE WITH FLUOROSCOPIC GUIDANCE Physician: Rachelle Hora. Lowella Dandy, MD MEDICATIONS: Ancef 2 g; Antibiotics were administered within 1 hour of the procedure. Glucagon 1 mg IV ANESTHESIA/SEDATION: Versed 1.0 mg IV; Fentanyl 50 mcg IV Moderate Sedation Time:  12 minutes The patient was continuously monitored during the procedure by the interventional radiology nurse under my direct supervision. FLUOROSCOPY TIME:  Fluoroscopy Time: 2 minutes, 6 seconds, 8 mGy COMPLICATIONS: None immediate. PROCEDURE: Informed consent  was obtained for a percutaneous gastrostomy tube. The patient was placed on the interventional table. Fluoroscopy demonstrated oral contrast in the transverse colon. An orogastric tube was placed with fluoroscopic guidance. The anterior abdomen was prepped and draped in sterile fashion. Maximal barrier sterile technique was utilized including  caps, mask, sterile gowns, sterile gloves, sterile drape, hand hygiene and skin antiseptic. Stomach was inflated with air through the orogastric tube. The skin and subcutaneous tissues were anesthetized with 1% lidocaine. A 17 gauge needle was directed into the distended stomach with fluoroscopic guidance. A wire was advanced into the stomach and a T-tact was deployed. A 9-French vascular sheath was placed and the orogastric tube was snared using a Gooseneck snare device. The orogastric tube and snare were pulled out of the patient's mouth. The snare device was connected to a 20-French gastrostomy tube. The snare device and gastrostomy tube were pulled through the patient's mouth and out the anterior abdominal wall. The gastrostomy tube was cut to an appropriate length. Contrast injection through gastrostomy tube confirmed placement within the stomach. Fluoroscopic images were obtained for documentation. The gastrostomy tube was flushed with normal saline. IMPRESSION: Successful fluoroscopic guided percutaneous gastrostomy tube placement. Electronically Signed   By: Richarda OverlieAdam  Henn M.D.   On: 09/22/2018 15:01   Ir Fluoro Rm 30-60 Min  Result Date: 09/21/2018 CLINICAL DATA:  78 year old with respiratory failure and dysphagia. Scheduled for percutaneous gastrostomy tube placement. EXAM: IR FLUORO RM 0-60 MIN ANESTHESIA/SEDATION: None FLUOROSCOPY TIME:  1 minutes and 48 seconds, 23 mGy MEDICATIONS: None CONTRAST:  Barium PROCEDURE: Patient was placed supine on interventional table. Fluoroscopic images were obtained. The splenic flexure of the colon is noted to be high in the left abdomen. Transverse colon was not clearly identified with gas. Therefore, attempted barium enema. Patient was placed on her side and a rectal tube was placed with viscous lidocaine. Despite inflating a retention balloon, the barium repeatedly came out of the rectum and unable to opacify the transverse colon. Therefore, approximately 120 mL of  barium was injected through the nasogastric tube. Tube was then flushed with saline. COMPLICATIONS: None immediate FINDINGS: Transverse colon was not confidently identified with fluoroscopy. Unable to opacify the colon with a barium enema. Barium placed in the stomach. IMPRESSION: Transverse colon was not confidently identify with fluoroscopy. Attempted barium enema was unsuccessful. Therefore, barium was injected through the nasogastric tube. Will get an abdominal radiograph on 09/22/2018 in order to hopefully evaluate the location of the transverse colon. When the transverse colon can be safely identified, will attempt gastrostomy tube placement. Electronically Signed   By: Richarda OverlieAdam  Henn M.D.   On: 09/21/2018 13:55    Assessment/Plan Active Problems:   Acute on chronic respiratory failure with hypoxia (HCC)   Ventilator dependent (HCC)   Cavitary pneumonia   Bilateral pulmonary embolism (HCC)   Severe sepsis (HCC)   Lobar pneumonia, unspecified organism (HCC)   1. Acute on chronic respiratory failure hypoxia we will continue T collar trials titrate oxygen continue pulmonary toilet. 2. Cavitary pneumonia improving we will continue with supportive care. 3. Bilateral pulmonary emboli at baseline continue supportive care 4. Severe sepsis resolved 5. Lobar pneumonia continue to follow radiologically continue with supportive care   I have personally seen and evaluated the patient, evaluated laboratory and imaging results, formulated the assessment and plan and placed orders. The Patient requires high complexity decision making for assessment and support.  Case was discussed on Rounds with the Respiratory Therapy Staff  Yevonne PaxSaadat A Deshara Rossi, MD  [facility] Pulmonary Critical Care Medicine Sleep Medicine

## 2018-09-24 DIAGNOSIS — I2699 Other pulmonary embolism without acute cor pulmonale: Secondary | ICD-10-CM | POA: Diagnosis not present

## 2018-09-24 DIAGNOSIS — J181 Lobar pneumonia, unspecified organism: Secondary | ICD-10-CM | POA: Diagnosis not present

## 2018-09-24 DIAGNOSIS — J9621 Acute and chronic respiratory failure with hypoxia: Secondary | ICD-10-CM | POA: Diagnosis not present

## 2018-09-24 DIAGNOSIS — J189 Pneumonia, unspecified organism: Secondary | ICD-10-CM | POA: Diagnosis not present

## 2018-09-24 LAB — HEPARIN LEVEL (UNFRACTIONATED)
Heparin Unfractionated: 0.18 IU/mL — ABNORMAL LOW (ref 0.30–0.70)
Heparin Unfractionated: 0.61 IU/mL (ref 0.30–0.70)
Heparin Unfractionated: 0.23 IU/mL — ABNORMAL LOW (ref 0.30–0.70)

## 2018-09-24 LAB — PROTIME-INR
INR: 1 (ref 0.8–1.2)
Prothrombin Time: 13.4 seconds (ref 11.4–15.2)

## 2018-09-24 LAB — CULTURE, BLOOD (ROUTINE X 2)
Culture: NO GROWTH
Culture: NO GROWTH
Special Requests: ADEQUATE

## 2018-09-24 NOTE — Progress Notes (Addendum)
Pulmonary Critical Care Medicine [facility] [place]   PULMONARY CRITICAL CARE SERVICE  PROGRESS NOTE  Date of Service: 09/24/2018  Monica Finley  EUM:[id number]  DOB: 1940-10-06   DOA: 09/03/2018  Referring Physician: Carron Curie, MD  HPI: Monica Finley is a 78 y.o. female seen for follow up of Acute on Chronic Respiratory Failure.  Patient remains aerosol trach collar 20% FiO2.  Good saturations no fever at this time.    Medications: Reviewed on Rounds  Physical Exam:  Vitals: Pulse 88 respirations 18 BP 166/78 O2 sat 100% temp 96.9  Ventilator Settings 28% ATC  . General: Comfortable at this time . Eyes: Grossly normal lids, irises & conjunctiva . ENT: grossly tongue is normal . Neck: no obvious mass . Cardiovascular: S1 S2 normal no gallop . Respiratory: No rales or rhonchi noted . Abdomen: soft . Skin: no rash seen on limited exam . Musculoskeletal: not rigid . Psychiatric:unable to assess . Neurologic: no seizure no involuntary movements         Lab Data:   Basic Metabolic Panel: Recent Labs  Lab 09/18/18 1014 09/21/18 0236  NA 129* 135  K 3.3* 4.1  CL 96* 98  CO2 25 27  GLUCOSE 160* 134*  BUN 14 22  CREATININE 0.54 0.60  CALCIUM 7.9* 8.6*    ABG: No results for input(s): PHART, PCO2ART, PO2ART, HCO3, O2SAT in the last 168 hours.  Liver Function Tests: No results for input(s): AST, ALT, ALKPHOS, BILITOT, PROT, ALBUMIN in the last 168 hours. No results for input(s): LIPASE, AMYLASE in the last 168 hours. No results for input(s): AMMONIA in the last 168 hours.  CBC: Recent Labs  Lab 09/21/18 0236 09/23/18 1059  WBC 8.8 9.8  NEUTROABS  --  6.8  HGB 9.0* 10.1*  HCT 28.3* 30.0*  MCV 91.9 90.4  PLT 429* 442*    Cardiac Enzymes: No results for input(s): CKTOTAL, CKMB, CKMBINDEX, TROPONINI in the last 168 hours.  BNP (last 3 results) No results for input(s): BNP in the last 8760 hours.  ProBNP (last 3 results) No  results for input(s): PROBNP in the last 8760 hours.  Radiological Exams: No results found.  Assessment/Plan Active Problems:   Acute on chronic respiratory failure with hypoxia (HCC)   Ventilator dependent (HCC)   Cavitary pneumonia   Bilateral pulmonary embolism (HCC)   Severe sepsis (HCC)   Lobar pneumonia, unspecified organism (HCC)   1. Acute on chronic respiratory failure with hypoxia we will continue with T collar trials titrate oxygen as tolerated.  Continue aggressive pulmonary with supportive measures. 2. Cavitary pneumonia improved continue supportive care 3. Bilateral pulmonary emboli at baseline continue supportive care 4. Severe sepsis resolved 5. Lobar pneumonia continue to follow radiologically continue supportive care   I have personally seen and evaluated the patient, evaluated laboratory and imaging results, formulated the assessment and plan and placed orders. The Patient requires high complexity decision making for assessment and support.  Case was discussed on Rounds with the Respiratory Therapy Staff  Yevonne Pax, MD [facility] Pulmonary Critical Care Medicine Sleep Medicine

## 2018-09-25 DIAGNOSIS — J189 Pneumonia, unspecified organism: Secondary | ICD-10-CM | POA: Diagnosis not present

## 2018-09-25 DIAGNOSIS — I2699 Other pulmonary embolism without acute cor pulmonale: Secondary | ICD-10-CM | POA: Diagnosis not present

## 2018-09-25 DIAGNOSIS — J9621 Acute and chronic respiratory failure with hypoxia: Secondary | ICD-10-CM | POA: Diagnosis not present

## 2018-09-25 DIAGNOSIS — J181 Lobar pneumonia, unspecified organism: Secondary | ICD-10-CM | POA: Diagnosis not present

## 2018-09-25 LAB — BLOOD GAS, ARTERIAL
Acid-Base Excess: 4.8 mmol/L — ABNORMAL HIGH (ref 0.0–2.0)
Bicarbonate: 29 mmol/L — ABNORMAL HIGH (ref 20.0–28.0)
FIO2: 0.28
O2 Saturation: 98.6 %
Patient temperature: 98.6
pCO2 arterial: 44.8 mmHg (ref 32.0–48.0)
pH, Arterial: 7.426 (ref 7.350–7.450)
pO2, Arterial: 119 mmHg — ABNORMAL HIGH (ref 83.0–108.0)

## 2018-09-25 LAB — C DIFFICILE QUICK SCREEN W PCR REFLEX
C Diff antigen: NEGATIVE
C Diff interpretation: NOT DETECTED
C Diff toxin: NEGATIVE

## 2018-09-25 LAB — PROTIME-INR
INR: 1.1 (ref 0.8–1.2)
Prothrombin Time: 13.9 seconds (ref 11.4–15.2)

## 2018-09-25 NOTE — Progress Notes (Addendum)
Pulmonary Critical Care Medicine [facility] [place]   PULMONARY CRITICAL CARE SERVICE  PROGRESS NOTE  Date of Service: 09/25/2018  Monica Finley  IHK:[id number]  DOB: [birth year]   DOA: 09/03/2018  Referring Physician: Carron Curie, MD  HPI: Monica Finley is a 78 y.o. female seen for follow up of Acute on Chronic Respiratory Failure.  Patient continues on aerosol trach collar 28% with no distress or fever noted.  Medications: Reviewed on Rounds  Physical Exam:  Vitals: Pulse 64 respirations 12 BP 103/47 O2 sat 100% 97.6  Ventilator Settings ATC 28%  . General: Comfortable at this time . Eyes: Grossly normal lids, irises & conjunctiva . ENT: grossly tongue is normal . Neck: no obvious mass . Cardiovascular: S1 S2 normal no gallop . Respiratory: No rales or rhonchi noted . Abdomen: soft . Skin: no rash seen on limited exam . Musculoskeletal: not rigid . Psychiatric:unable to assess . Neurologic: no seizure no involuntary movements         Lab Data:   Basic Metabolic Panel: Recent Labs  Lab 09/21/18 0236  NA 135  K 4.1  CL 98  CO2 27  GLUCOSE 134*  BUN 22  CREATININE 0.60  CALCIUM 8.6*    ABG: Recent Labs  Lab 09/25/18 0530  PHART 7.426  PCO2ART 44.8  PO2ART 119*  HCO3 29.0*  O2SAT 98.6    Liver Function Tests: No results for input(s): AST, ALT, ALKPHOS, BILITOT, PROT, ALBUMIN in the last 168 hours. No results for input(s): LIPASE, AMYLASE in the last 168 hours. No results for input(s): AMMONIA in the last 168 hours.  CBC: Recent Labs  Lab 09/21/18 0236 09/23/18 1059  WBC 8.8 9.8  NEUTROABS  --  6.8  HGB 9.0* 10.1*  HCT 28.3* 30.0*  MCV 91.9 90.4  PLT 429* 442*    Cardiac Enzymes: No results for input(s): CKTOTAL, CKMB, CKMBINDEX, TROPONINI in the last 168 hours.  BNP (last 3 results) No results for input(s): BNP in the last 8760 hours.  ProBNP (last 3 results) No results for input(s): PROBNP in the last 8760  hours.  Radiological Exams: No results found.  Assessment/Plan Active Problems:   Acute on chronic respiratory failure with hypoxia (HCC)   Ventilator dependent (HCC)   Cavitary pneumonia   Bilateral pulmonary embolism (HCC)   Severe sepsis (HCC)   Lobar pneumonia, unspecified organism (HCC)   1. Acute on chronic respiratory failure with hypoxia we will continue with T collar trials titrate oxygen as tolerated.  Continue aggressive pulmonary with supportive measures. 2. Cavitary pneumonia improved continue supportive care 3. Bilateral pulmonary emboli at baseline continue supportive care 4. Severe sepsis resolved 5. Lobar pneumonia continue to follow radiologically continue supportive care   I have personally seen and evaluated the patient, evaluated laboratory and imaging results, formulated the assessment and plan and placed orders. The Patient requires high complexity decision making for assessment and support.  Case was discussed on Rounds with the Respiratory Therapy Staff  Yevonne Pax, MD [facility] Pulmonary Critical Care Medicine Sleep Medicine

## 2018-09-26 DIAGNOSIS — J181 Lobar pneumonia, unspecified organism: Secondary | ICD-10-CM | POA: Diagnosis not present

## 2018-09-26 DIAGNOSIS — J9621 Acute and chronic respiratory failure with hypoxia: Secondary | ICD-10-CM | POA: Diagnosis not present

## 2018-09-26 DIAGNOSIS — I2699 Other pulmonary embolism without acute cor pulmonale: Secondary | ICD-10-CM | POA: Diagnosis not present

## 2018-09-26 DIAGNOSIS — J189 Pneumonia, unspecified organism: Secondary | ICD-10-CM | POA: Diagnosis not present

## 2018-09-26 LAB — PROTIME-INR
INR: 1.4 — ABNORMAL HIGH (ref 0.8–1.2)
Prothrombin Time: 16.9 seconds — ABNORMAL HIGH (ref 11.4–15.2)

## 2018-09-26 LAB — HEMOGLOBIN AND HEMATOCRIT, BLOOD
HCT: 28.2 % — ABNORMAL LOW (ref 36.0–46.0)
Hemoglobin: 8.8 g/dL — ABNORMAL LOW (ref 12.0–15.0)

## 2018-09-26 NOTE — Progress Notes (Signed)
Pulmonary Critical Care Medicine [facility] [place]   PULMONARY CRITICAL CARE SERVICE  PROGRESS NOTE  Date of Service: 09/26/2018  Monica Finley  ION:[id number]  DOB: Dec 31, 1940   DOA: 09/03/2018  Referring Physician: Carron Curie, MD  HPI: Monica Finley is a 78 y.o. female seen for follow up of Acute on Chronic Respiratory Failure.  Patient currently is on T collar has been on 28% FiO2 doing fairly well secretions are minimal  Medications: Reviewed on Rounds  Physical Exam:  Vitals: Temperature 97.0 pulse 71 respiratory rate 12 blood pressure is 109/61 saturations 100%  Ventilator Settings currently on T collar FiO2 is 28%  . General: Comfortable at this time . Eyes: Grossly normal lids, irises & conjunctiva . ENT: grossly tongue is normal . Neck: no obvious mass . Cardiovascular: S1 S2 normal no gallop . Respiratory: No rhonchi or rales are noted at this time . Abdomen: soft . Skin: no rash seen on limited exam . Musculoskeletal: not rigid . Psychiatric:unable to assess . Neurologic: no seizure no involuntary movements         Lab Data:   Basic Metabolic Panel: Recent Labs  Lab 09/21/18 0236  NA 135  K 4.1  CL 98  CO2 27  GLUCOSE 134*  BUN 22  CREATININE 0.60  CALCIUM 8.6*    ABG: Recent Labs  Lab 09/25/18 0530  PHART 7.426  PCO2ART 44.8  PO2ART 119*  HCO3 29.0*  O2SAT 98.6    Liver Function Tests: No results for input(s): AST, ALT, ALKPHOS, BILITOT, PROT, ALBUMIN in the last 168 hours. No results for input(s): LIPASE, AMYLASE in the last 168 hours. No results for input(s): AMMONIA in the last 168 hours.  CBC: Recent Labs  Lab 09/21/18 0236 09/23/18 1059 09/26/18 1532  WBC 8.8 9.8  --   NEUTROABS  --  6.8  --   HGB 9.0* 10.1* 8.8*  HCT 28.3* 30.0* 28.2*  MCV 91.9 90.4  --   PLT 429* 442*  --     Cardiac Enzymes: No results for input(s): CKTOTAL, CKMB, CKMBINDEX, TROPONINI in the last 168 hours.  BNP (last 3  results) No results for input(s): BNP in the last 8760 hours.  ProBNP (last 3 results) No results for input(s): PROBNP in the last 8760 hours.  Radiological Exams: No results found.  Assessment/Plan Active Problems:   Acute on chronic respiratory failure with hypoxia (HCC)   Ventilator dependent (HCC)   Cavitary pneumonia   Bilateral pulmonary embolism (HCC)   Severe sepsis (HCC)   Lobar pneumonia, unspecified organism (HCC)   1. Acute on chronic respiratory failure hypoxia we will change to a cuffless trach and then try to attempt capping. 2. Ventilator dependent resolved 3. Cavitary pneumonia improving 4. Bilateral pulmonary emboli stable continue with supportive care 5. Severe sepsis right now hemodynamically stable 6. Lobar pneumonia clinically is improving   I have personally seen and evaluated the patient, evaluated laboratory and imaging results, formulated the assessment and plan and placed orders. The Patient requires high complexity decision making for assessment and support.  Case was discussed on Rounds with the Respiratory Therapy Staff  Yevonne Pax, MD [facility] Pulmonary Critical Care Medicine Sleep Medicine

## 2018-09-27 DIAGNOSIS — J189 Pneumonia, unspecified organism: Secondary | ICD-10-CM | POA: Diagnosis not present

## 2018-09-27 DIAGNOSIS — I2699 Other pulmonary embolism without acute cor pulmonale: Secondary | ICD-10-CM | POA: Diagnosis not present

## 2018-09-27 DIAGNOSIS — J9621 Acute and chronic respiratory failure with hypoxia: Secondary | ICD-10-CM | POA: Diagnosis not present

## 2018-09-27 DIAGNOSIS — J181 Lobar pneumonia, unspecified organism: Secondary | ICD-10-CM | POA: Diagnosis not present

## 2018-09-27 LAB — CBC
HCT: 26.4 % — ABNORMAL LOW (ref 36.0–46.0)
Hemoglobin: 8.4 g/dL — ABNORMAL LOW (ref 12.0–15.0)
MCH: 30.2 pg (ref 26.0–34.0)
MCHC: 31.8 g/dL (ref 30.0–36.0)
MCV: 95 fL (ref 80.0–100.0)
Platelets: 314 10*3/uL (ref 150–400)
RBC: 2.78 MIL/uL — ABNORMAL LOW (ref 3.87–5.11)
RDW: 14.8 % (ref 11.5–15.5)
WBC: 8.2 10*3/uL (ref 4.0–10.5)
nRBC: 0 % (ref 0.0–0.2)

## 2018-09-27 LAB — BASIC METABOLIC PANEL
Anion gap: 8 (ref 5–15)
BUN: 25 mg/dL — ABNORMAL HIGH (ref 8–23)
CO2: 27 mmol/L (ref 22–32)
Calcium: 8.3 mg/dL — ABNORMAL LOW (ref 8.9–10.3)
Chloride: 98 mmol/L (ref 98–111)
Creatinine, Ser: 0.64 mg/dL (ref 0.44–1.00)
GFR calc Af Amer: >60 mL/min (ref >60)
GFR calc non Af Amer: >60 mL/min (ref >60)
Glucose, Bld: 136 mg/dL — ABNORMAL HIGH (ref 70–99)
Potassium: 4.2 mmol/L (ref 3.5–5.1)
Sodium: 133 mmol/L — ABNORMAL LOW (ref 135–145)

## 2018-09-27 LAB — PROTIME-INR
INR: 1.5 — ABNORMAL HIGH (ref 0.8–1.2)
Prothrombin Time: 17.8 seconds — ABNORMAL HIGH (ref 11.4–15.2)

## 2018-09-27 NOTE — Progress Notes (Addendum)
Pulmonary Critical Care Medicine [facility] [place]   PULMONARY CRITICAL CARE SERVICE  PROGRESS NOTE  Date of Service: 09/27/2018  Epimenia Libera Finigan  [id number]:[id number]  DOB: 1940-06-19   DOA: 09/03/2018  Referring Physician: Carron Curie, MD  HPI: DELISSA ZOERB is a 78 y.o. female seen for follow up of Acute on Chronic Respiratory Failure.  Patient has now been capped on room air for 24 hours.  Satting well no distress or fever at this time.  Medications: Reviewed on Rounds  Physical Exam:  Vitals: Pulse 79 respirations 20 BP one 3/69 O2 sat 98% temp 98.6  Ventilator Settings room air  . General: Comfortable at this time . Eyes: Grossly normal lids, irises & conjunctiva . ENT: grossly tongue is normal . Neck: no obvious mass . Cardiovascular: S1 S2 normal no gallop . Respiratory: No wheeze or rhonchi noted . Abdomen: soft . Skin: no rash seen on limited exam . Musculoskeletal: not rigid . Psychiatric:unable to assess . Neurologic: no seizure no involuntary movements         Lab Data:   Basic Metabolic Panel: Recent Labs  Lab 09/21/18 0236 09/27/18 0721  NA 135 133*  K 4.1 4.2  CL 98 98  CO2 27 27  GLUCOSE 134* 136*  BUN 22 25*  CREATININE 0.60 0.64  CALCIUM 8.6* 8.3*    ABG: Recent Labs  Lab 09/25/18 0530  PHART 7.426  PCO2ART 44.8  PO2ART 119*  HCO3 29.0*  O2SAT 98.6    Liver Function Tests: No results for input(s): AST, ALT, ALKPHOS, BILITOT, PROT, ALBUMIN in the last 168 hours. No results for input(s): LIPASE, AMYLASE in the last 168 hours. No results for input(s): AMMONIA in the last 168 hours.  CBC: Recent Labs  Lab 09/21/18 0236 09/23/18 1059 09/26/18 1532 09/27/18 0721  WBC 8.8 9.8  --  8.2  NEUTROABS  --  6.8  --   --   HGB 9.0* 10.1* 8.8* 8.4*  HCT 28.3* 30.0* 28.2* 26.4*  MCV 91.9 90.4  --  95.0  PLT 429* 442*  --  314    Cardiac Enzymes: No results for input(s): CKTOTAL, CKMB, CKMBINDEX, TROPONINI in the  last 168 hours.  BNP (last 3 results) No results for input(s): BNP in the last 8760 hours.  ProBNP (last 3 results) No results for input(s): PROBNP in the last 8760 hours.  Radiological Exams: No results found.  Assessment/Plan Active Problems:   Acute on chronic respiratory failure with hypoxia (HCC)   Ventilator dependent (HCC)   Cavitary pneumonia   Bilateral pulmonary embolism (HCC)   Severe sepsis (HCC)   Lobar pneumonia, unspecified organism (HCC)   1. Acute on chronic respiratory failure hypoxia patient has been capped on room air for 24 hours satting well at this time.  Continue pulmonary toilet supportive measures.  Ventilator dependent resolved 2. Cavitary pneumonia improving 3. Bilateral pulmonary emboli stable continue with supportive care 4. Severe sepsis right now hemodynamically stable 5. Lobar pneumonia clinically is improving   I have personally seen and evaluated the patient, evaluated laboratory and imaging results, formulated the assessment and plan and placed orders. The Patient requires high complexity decision making for assessment and support.  Case was discussed on Rounds with the Respiratory Therapy Staff  Yevonne Pax, MD [facility] Pulmonary Critical Care Medicine Sleep Medicine

## 2018-09-28 DIAGNOSIS — I2699 Other pulmonary embolism without acute cor pulmonale: Secondary | ICD-10-CM | POA: Diagnosis not present

## 2018-09-28 DIAGNOSIS — J181 Lobar pneumonia, unspecified organism: Secondary | ICD-10-CM | POA: Diagnosis not present

## 2018-09-28 DIAGNOSIS — J9621 Acute and chronic respiratory failure with hypoxia: Secondary | ICD-10-CM | POA: Diagnosis not present

## 2018-09-28 DIAGNOSIS — J189 Pneumonia, unspecified organism: Secondary | ICD-10-CM | POA: Diagnosis not present

## 2018-09-28 LAB — PROTIME-INR
INR: 1.6 — ABNORMAL HIGH (ref 0.8–1.2)
Prothrombin Time: 18.9 s — ABNORMAL HIGH (ref 11.4–15.2)

## 2018-09-28 NOTE — Progress Notes (Addendum)
Pulmonary Critical Care Medicine [facility] [place]   PULMONARY CRITICAL CARE SERVICE  PROGRESS NOTE  Date of Service: 09/28/2018  Monica Finley  [id number]  DOB: 1940-11-24   DOA: 09/03/2018  Referring Physician: Carron Curie, MD  HPI: Monica Finley is a 78 y.o. female seen for follow up of Acute on Chronic Respiratory Failure.  Patient has been capped for 48 hours on room air.  Satting well with no distress.  Medications: Reviewed on Rounds  Physical Exam:  Vitals: Pulse 78 respirations 19 BP 131/57 O2 sat 98% temp 97 8  Ventilator Settings room air  . General: Comfortable at this time . Eyes: Grossly normal lids, irises & conjunctiva . ENT: grossly tongue is normal . Neck: no obvious mass . Cardiovascular: S1 S2 normal no gallop . Respiratory: No rales or rhonchi noted . Abdomen: soft . Skin: no rash seen on limited exam . Musculoskeletal: not rigid . Psychiatric:unable to assess . Neurologic: no seizure no involuntary movements         Lab Data:   Basic Metabolic Panel: Recent Labs  Lab 09/27/18 0721  NA 133*  K 4.2  CL 98  CO2 27  GLUCOSE 136*  BUN 25*  CREATININE 0.64  CALCIUM 8.3*    ABG: Recent Labs  Lab 09/25/18 0530  PHART 7.426  PCO2ART 44.8  PO2ART 119*  HCO3 29.0*  O2SAT 98.6    Liver Function Tests: No results for input(s): AST, ALT, ALKPHOS, BILITOT, PROT, ALBUMIN in the last 168 hours. No results for input(s): LIPASE, AMYLASE in the last 168 hours. No results for input(s): AMMONIA in the last 168 hours.  CBC: Recent Labs  Lab 09/23/18 1059 09/26/18 1532 09/27/18 0721  WBC 9.8  --  8.2  NEUTROABS 6.8  --   --   HGB 10.1* 8.8* 8.4*  HCT 30.0* 28.2* 26.4*  MCV 90.4  --  95.0  PLT 442*  --  314    Cardiac Enzymes: No results for input(s): CKTOTAL, CKMB, CKMBINDEX, TROPONINI in the last 168 hours.  BNP (last 3 results) No results for input(s): BNP in the last 8760 hours.  ProBNP (last 3  results) No results for input(s): PROBNP in the last 8760 hours.  Radiological Exams: No results found.  Assessment/Plan Active Problems:   Acute on chronic respiratory failure with hypoxia (HCC)   Ventilator dependent (HCC)   Cavitary pneumonia   Bilateral pulmonary embolism (HCC)   Severe sepsis (HCC)   Lobar pneumonia, unspecified organism (HCC)   1. Acute on chronic respiratory failure hypoxia patient has been capped on room air for 48 hours satting well at this time.  Continue pulmonary toilet supportive measures.  2.  Ventilator dependent resolved 3. Cavitary pneumonia improving 4. Bilateral pulmonary emboli stable continue with supportive care 5. Severe sepsis right now hemodynamically stable 6. Lobar pneumonia clinically is improving   I have personally seen and evaluated the patient, evaluated laboratory and imaging results, formulated the assessment and plan and placed orders. The Patient requires high complexity decision making for assessment and support.  Case was discussed on Rounds with the Respiratory Therapy Staff  Yevonne Pax, MD [facility] Pulmonary Critical Care Medicine Sleep Medicine

## 2018-09-29 DIAGNOSIS — J9621 Acute and chronic respiratory failure with hypoxia: Secondary | ICD-10-CM | POA: Diagnosis not present

## 2018-09-29 DIAGNOSIS — J181 Lobar pneumonia, unspecified organism: Secondary | ICD-10-CM | POA: Diagnosis not present

## 2018-09-29 DIAGNOSIS — J189 Pneumonia, unspecified organism: Secondary | ICD-10-CM | POA: Diagnosis not present

## 2018-09-29 DIAGNOSIS — I2699 Other pulmonary embolism without acute cor pulmonale: Secondary | ICD-10-CM | POA: Diagnosis not present

## 2018-09-29 LAB — BASIC METABOLIC PANEL
Anion gap: 9 (ref 5–15)
BUN: 30 mg/dL — ABNORMAL HIGH (ref 8–23)
CO2: 28 mmol/L (ref 22–32)
Calcium: 8.6 mg/dL — ABNORMAL LOW (ref 8.9–10.3)
Chloride: 99 mmol/L (ref 98–111)
Creatinine, Ser: 0.64 mg/dL (ref 0.44–1.00)
GFR calc Af Amer: >60 mL/min (ref >60)
GFR calc non Af Amer: >60 mL/min (ref >60)
Glucose, Bld: 114 mg/dL — ABNORMAL HIGH (ref 70–99)
Potassium: 4.1 mmol/L (ref 3.5–5.1)
Sodium: 136 mmol/L (ref 135–145)

## 2018-09-29 LAB — CBC
HCT: 27.2 % — ABNORMAL LOW (ref 36.0–46.0)
Hemoglobin: 8.8 g/dL — ABNORMAL LOW (ref 12.0–15.0)
MCH: 29.9 pg (ref 26.0–34.0)
MCHC: 32.4 g/dL (ref 30.0–36.0)
MCV: 92.5 fL (ref 80.0–100.0)
Platelets: 322 10*3/uL (ref 150–400)
RBC: 2.94 MIL/uL — ABNORMAL LOW (ref 3.87–5.11)
RDW: 14.5 % (ref 11.5–15.5)
WBC: 10.7 10*3/uL — ABNORMAL HIGH (ref 4.0–10.5)
nRBC: 0 % (ref 0.0–0.2)

## 2018-09-29 LAB — PROTIME-INR
INR: 2.1 — ABNORMAL HIGH (ref 0.8–1.2)
Prothrombin Time: 23.5 seconds — ABNORMAL HIGH (ref 11.4–15.2)

## 2018-09-29 NOTE — Progress Notes (Addendum)
Pulmonary Critical Care Medicine [facility] [place]   PULMONARY CRITICAL CARE SERVICE  PROGRESS NOTE  Date of Service: 09/29/2018  Monica Finley  [id number]  DOB: [birth year]   DOA: 09/03/2018  Referring Physician: Carron Curie, MD  HPI: Monica Finley is a 78 y.o. female seen for follow up of Acute on Chronic Respiratory Failure.  Patient has now been capped on room air for 72 hours doing well with no secretions.  We will decannulate patient today.  Medications: Reviewed on Rounds  Physical Exam:  Vitals: Pulse 86 respirations 32 BP 133/72 O2 sat 100% 97.0  Ventilator Settings room air  . General: Comfortable at this time . Eyes: Grossly normal lids, irises & conjunctiva . ENT: grossly tongue is normal . Neck: no obvious mass . Cardiovascular: S1 S2 normal no gallop . Respiratory: No rales or rhonchi noted . Abdomen: soft . Skin: no rash seen on limited exam . Musculoskeletal: not rigid . Psychiatric:unable to assess . Neurologic: no seizure no involuntary movements         Lab Data:   Basic Metabolic Panel: Recent Labs  Lab 09/27/18 0721 09/29/18 0524  NA 133* 136  K 4.2 4.1  CL 98 99  CO2 27 28  GLUCOSE 136* 114*  BUN 25* 30*  CREATININE 0.64 0.64  CALCIUM 8.3* 8.6*    ABG: Recent Labs  Lab 09/25/18 0530  PHART 7.426  PCO2ART 44.8  PO2ART 119*  HCO3 29.0*  O2SAT 98.6    Liver Function Tests: No results for input(s): AST, ALT, ALKPHOS, BILITOT, PROT, ALBUMIN in the last 168 hours. No results for input(s): LIPASE, AMYLASE in the last 168 hours. No results for input(s): AMMONIA in the last 168 hours.  CBC: Recent Labs  Lab 09/23/18 1059 09/26/18 1532 09/27/18 0721 09/29/18 0524  WBC 9.8  --  8.2 10.7*  NEUTROABS 6.8  --   --   --   HGB 10.1* 8.8* 8.4* 8.8*  HCT 30.0* 28.2* 26.4* 27.2*  MCV 90.4  --  95.0 92.5  PLT 442*  --  314 322    Cardiac Enzymes: No results for input(s): CKTOTAL, CKMB, CKMBINDEX,  TROPONINI in the last 168 hours.  BNP (last 3 results) No results for input(s): BNP in the last 8760 hours.  ProBNP (last 3 results) No results for input(s): PROBNP in the last 8760 hours.  Radiological Exams: No results found.  Assessment/Plan Active Problems:   Acute on chronic respiratory failure with hypoxia (HCC)   Ventilator dependent (HCC)   Cavitary pneumonia   Bilateral pulmonary embolism (HCC)   Severe sepsis (HCC)   Lobar pneumonia, unspecified organism (HCC)   1. Acute on chronic respiratory failure hypoxiapatient has been capped on room air for  72 hours.  We will decannulate patient today. Continue pulmonary toilet supportive measures.  2. Ventilator dependent resolved 3. Cavitary pneumonia improving 4. Bilateral pulmonary emboli stable continue with supportive care 5. Severe sepsis right now hemodynamically stable 6. Lobar pneumonia clinically is improving   I have personally seen and evaluated the patient, evaluated laboratory and imaging results, formulated the assessment and plan and placed orders. The Patient requires high complexity decision making for assessment and support.  Case was discussed on Rounds with the Respiratory Therapy Staff  Yevonne Pax, MD [facility] Pulmonary Critical Care Medicine Sleep Medicine

## 2018-09-30 ENCOUNTER — Other Ambulatory Visit (HOSPITAL_COMMUNITY): Payer: Medicare Other

## 2018-09-30 DIAGNOSIS — J9621 Acute and chronic respiratory failure with hypoxia: Secondary | ICD-10-CM | POA: Diagnosis not present

## 2018-09-30 DIAGNOSIS — I2699 Other pulmonary embolism without acute cor pulmonale: Secondary | ICD-10-CM | POA: Diagnosis not present

## 2018-09-30 DIAGNOSIS — J189 Pneumonia, unspecified organism: Secondary | ICD-10-CM | POA: Diagnosis not present

## 2018-09-30 DIAGNOSIS — J181 Lobar pneumonia, unspecified organism: Secondary | ICD-10-CM | POA: Diagnosis not present

## 2018-09-30 LAB — PROTIME-INR
INR: 2.8 — ABNORMAL HIGH (ref 0.8–1.2)
Prothrombin Time: 29.4 seconds — ABNORMAL HIGH (ref 11.4–15.2)

## 2018-09-30 MED ORDER — GENERIC EXTERNAL MEDICATION
Status: DC
Start: ? — End: 2018-09-30

## 2018-09-30 NOTE — Progress Notes (Addendum)
Pulmonary Critical Care Medicine [facility] [place]   PULMONARY CRITICAL CARE SERVICE  PROGRESS NOTE  Date of Service: 09/30/2018  Monica Finley  [id number]  DOB: 10/13/1940   DOA: 09/03/2018  Referring Physician: Carron Curie, MD  HPI: Monica Finley is a 78 y.o. female seen for follow up of Acute on Chronic Respiratory Failure.  Patient was decannulated yesterday has plans for swallow study today continue on room air at this time with no distress.  Medications: Reviewed on Rounds  Physical Exam:  Vitals: Pulse 78 respirations 38 BP 1 5475 O2 sat 99% temp 99.3  Ventilator Settings room air  . General: Comfortable at this time . Eyes: Grossly normal lids, irises & conjunctiva . ENT: grossly tongue is normal . Neck: no obvious mass . Cardiovascular: S1 S2 normal no gallop . Respiratory: No rales or rhonchi noted . Abdomen: soft . Skin: no rash seen on limited exam . Musculoskeletal: not rigid . Psychiatric:unable to assess . Neurologic: no seizure no involuntary movements         Lab Data:   Basic Metabolic Panel: Recent Labs  Lab 09/27/18 0721 09/29/18 0524  NA 133* 136  K 4.2 4.1  CL 98 99  CO2 27 28  GLUCOSE 136* 114*  BUN 25* 30*  CREATININE 0.64 0.64  CALCIUM 8.3* 8.6*    ABG: Recent Labs  Lab 09/25/18 0530  PHART 7.426  PCO2ART 44.8  PO2ART 119*  HCO3 29.0*  O2SAT 98.6    Liver Function Tests: No results for input(s): AST, ALT, ALKPHOS, BILITOT, PROT, ALBUMIN in the last 168 hours. No results for input(s): LIPASE, AMYLASE in the last 168 hours. No results for input(s): AMMONIA in the last 168 hours.  CBC: Recent Labs  Lab 09/26/18 1532 09/27/18 0721 09/29/18 0524  WBC  --  8.2 10.7*  HGB 8.8* 8.4* 8.8*  HCT 28.2* 26.4* 27.2*  MCV  --  95.0 92.5  PLT  --  314 322    Cardiac Enzymes: No results for input(s): CKTOTAL, CKMB, CKMBINDEX, TROPONINI in the last 168 hours.  BNP (last 3 results) No results for  input(s): BNP in the last 8760 hours.  ProBNP (last 3 results) No results for input(s): PROBNP in the last 8760 hours.  Radiological Exams: No results found.  Assessment/Plan Active Problems:   Acute on chronic respiratory failure with hypoxia (HCC)   Ventilator dependent (HCC)   Cavitary pneumonia   Bilateral pulmonary embolism (HCC)   Severe sepsis (HCC)   Lobar pneumonia, unspecified organism (HCC)   1. Acute on chronic respiratory failure with hypoxia patient has been decannulated for 24 hours and is continuing to do well on room air.  Continue supportive measures at this time. 2. Ventilator dependent resolved 3. Cavitary pneumonia improved 4. Bilateral pulmonary emboli stable continue supportive care 5. Severe sepsis hemodynamically stable 6. Lobar pneumonia clinically improving   I have personally seen and evaluated the patient, evaluated laboratory and imaging results, formulated the assessment and plan and placed orders. The Patient requires high complexity decision making for assessment and support.  Case was discussed on Rounds with the Respiratory Therapy Staff  Yevonne Pax, MD [facility] Pulmonary Critical Care Medicine Sleep Medicine

## 2018-10-01 DIAGNOSIS — I2699 Other pulmonary embolism without acute cor pulmonale: Secondary | ICD-10-CM | POA: Diagnosis not present

## 2018-10-01 DIAGNOSIS — J9621 Acute and chronic respiratory failure with hypoxia: Secondary | ICD-10-CM | POA: Diagnosis not present

## 2018-10-01 DIAGNOSIS — J181 Lobar pneumonia, unspecified organism: Secondary | ICD-10-CM | POA: Diagnosis not present

## 2018-10-01 DIAGNOSIS — J189 Pneumonia, unspecified organism: Secondary | ICD-10-CM | POA: Diagnosis not present

## 2018-10-01 LAB — PROTIME-INR
INR: 3.1 — ABNORMAL HIGH (ref 0.8–1.2)
Prothrombin Time: 31.7 seconds — ABNORMAL HIGH (ref 11.4–15.2)

## 2018-10-01 LAB — CBC
HCT: 29 % — ABNORMAL LOW (ref 36.0–46.0)
Hemoglobin: 9.4 g/dL — ABNORMAL LOW (ref 12.0–15.0)
MCH: 30.3 pg (ref 26.0–34.0)
MCHC: 32.4 g/dL (ref 30.0–36.0)
MCV: 93.5 fL (ref 80.0–100.0)
Platelets: 317 10*3/uL (ref 150–400)
RBC: 3.1 MIL/uL — ABNORMAL LOW (ref 3.87–5.11)
RDW: 14.4 % (ref 11.5–15.5)
WBC: 10.6 10*3/uL — ABNORMAL HIGH (ref 4.0–10.5)
nRBC: 0 % (ref 0.0–0.2)

## 2018-10-01 NOTE — Progress Notes (Addendum)
Pulmonary Critical Care Medicine [facility] [place]   PULMONARY CRITICAL CARE SERVICE  PROGRESS NOTE  Date of Service: 10/01/2018  Monica Finley  [id number]  DOB: 02-03-1941   DOA: 09/03/2018  Referring Physician: Carron Curie, MD  HPI: Monica Finley is a 78 y.o. female seen for follow up of Acute on Chronic Respiratory Failure.  Patient continues on room air satting well with no distress at this time.  Medications: Reviewed on Rounds  Physical Exam:  Vitals: Pulse 85 respirations 23 BP 125/50 O2 sat 100% temp 99.5  Ventilator Settings room air  . General: Comfortable at this time . Eyes: Grossly normal lids, irises & conjunctiva . ENT: grossly tongue is normal . Neck: no obvious mass . Cardiovascular: S1 S2 normal no gallop . Respiratory: No rales or rhonchi noted . Abdomen: soft . Skin: no rash seen on limited exam . Musculoskeletal: not rigid . Psychiatric:unable to assess . Neurologic: no seizure no involuntary movements         Lab Data:   Basic Metabolic Panel: Recent Labs  Lab 09/27/18 0721 09/29/18 0524  NA 133* 136  K 4.2 4.1  CL 98 99  CO2 27 28  GLUCOSE 136* 114*  BUN 25* 30*  CREATININE 0.64 0.64  CALCIUM 8.3* 8.6*    ABG: Recent Labs  Lab 09/25/18 0530  PHART 7.426  PCO2ART 44.8  PO2ART 119*  HCO3 29.0*  O2SAT 98.6    Liver Function Tests: No results for input(s): AST, ALT, ALKPHOS, BILITOT, PROT, ALBUMIN in the last 168 hours. No results for input(s): LIPASE, AMYLASE in the last 168 hours. No results for input(s): AMMONIA in the last 168 hours.  CBC: Recent Labs  Lab 09/26/18 1532 09/27/18 0721 09/29/18 0524 10/01/18 0944  WBC  --  8.2 10.7* 10.6*  HGB 8.8* 8.4* 8.8* 9.4*  HCT 28.2* 26.4* 27.2* 29.0*  MCV  --  95.0 92.5 93.5  PLT  --  314 322 317    Cardiac Enzymes: No results for input(s): CKTOTAL, CKMB, CKMBINDEX, TROPONINI in the last 168 hours.  BNP (last 3 results) No results for  input(s): BNP in the last 8760 hours.  ProBNP (last 3 results) No results for input(s): PROBNP in the last 8760 hours.  Radiological Exams: No results found.  Assessment/Plan Active Problems:   Acute on chronic respiratory failure with hypoxia (HCC)   Ventilator dependent (HCC)   Cavitary pneumonia   Bilateral pulmonary embolism (HCC)   Severe sepsis (HCC)   Lobar pneumonia, unspecified organism (HCC)   1. Acute on chronic respiratory failure with hypoxia patient has been decannulated for 48 hours and is continuing to do well on room air.  Continue supportive measures at this time. 2. Ventilator dependent resolved 3. Cavitary pneumonia improved 4. Bilateral pulmonary emboli stable continue supportive care 5. Severe sepsis hemodynamically stable 6. Lobar pneumonia clinically improving   I have personally seen and evaluated the patient, evaluated laboratory and imaging results, formulated the assessment and plan and placed orders. The Patient requires high complexity decision making for assessment and support.  Case was discussed on Rounds with the Respiratory Therapy Staff  Yevonne Pax, MD [facility] Pulmonary Critical Care Medicine Sleep Medicine

## 2018-10-02 DIAGNOSIS — J189 Pneumonia, unspecified organism: Secondary | ICD-10-CM | POA: Diagnosis not present

## 2018-10-02 DIAGNOSIS — I2699 Other pulmonary embolism without acute cor pulmonale: Secondary | ICD-10-CM | POA: Diagnosis not present

## 2018-10-02 DIAGNOSIS — J9621 Acute and chronic respiratory failure with hypoxia: Secondary | ICD-10-CM | POA: Diagnosis not present

## 2018-10-02 DIAGNOSIS — J181 Lobar pneumonia, unspecified organism: Secondary | ICD-10-CM | POA: Diagnosis not present

## 2018-10-02 LAB — PROTIME-INR
INR: 2.5 — ABNORMAL HIGH (ref 0.8–1.2)
Prothrombin Time: 26.6 seconds — ABNORMAL HIGH (ref 11.4–15.2)

## 2018-10-02 NOTE — Progress Notes (Addendum)
Pulmonary Critical Care Medicine [place]   PULMONARY CRITICAL CARE SERVICE  PROGRESS NOTE  Date of Service: 10/02/2018  Monica Finley  [id number]  DOB: 1940/09/30   DOA: 09/03/2018  Referring Physician: Merton Border, MD  HPI: Monica Finley is a 78 y.o. female seen for follow up of Acute on Chronic Respiratory Failure.  Patient remains on room air at this time satting well with no distress.  Medications: Reviewed on Rounds  Physical Exam:  Vitals: Pulse 107 respirations 86 BP 154/69 O2 sat 99% temp 98.9  Ventilator Settings room air  . General: Comfortable at this time . Eyes: Grossly normal lids, irises & conjunctiva . ENT: grossly tongue is normal . Neck: no obvious mass . Cardiovascular: S1 S2 normal no gallop . Respiratory: No rales rhonchi noted . Abdomen: soft . Skin: no rash seen on limited exam . Musculoskeletal: not rigid . Psychiatric:unable to assess . Neurologic: no seizure no involuntary movements         Lab Data:   Basic Metabolic Panel: Recent Labs  Lab 09/27/18 0721 09/29/18 0524  NA 133* 136  K 4.2 4.1  CL 98 99  CO2 27 28  GLUCOSE 136* 114*  BUN 25* 30*  CREATININE 0.64 0.64  CALCIUM 8.3* 8.6*    ABG: No results for input(s): PHART, PCO2ART, PO2ART, HCO3, O2SAT in the last 168 hours.  Liver Function Tests: No results for input(s): AST, ALT, ALKPHOS, BILITOT, PROT, ALBUMIN in the last 168 hours. No results for input(s): LIPASE, AMYLASE in the last 168 hours. No results for input(s): AMMONIA in the last 168 hours.  CBC: Recent Labs  Lab 09/26/18 1532 09/27/18 0721 09/29/18 0524 10/01/18 0944  WBC  --  8.2 10.7* 10.6*  HGB 8.8* 8.4* 8.8* 9.4*  HCT 28.2* 26.4* 27.2* 29.0*  MCV  --  95.0 92.5 93.5  PLT  --  314 322 317    Cardiac Enzymes: No results for input(s): CKTOTAL, CKMB, CKMBINDEX, TROPONINI in the last 168 hours.  BNP (last 3 results) No results for input(s): BNP in the last 8760  hours.  ProBNP (last 3 results) No results for input(s): PROBNP in the last 8760 hours.  Radiological Exams: No results found.  Assessment/Plan Active Problems:   Acute on chronic respiratory failure with hypoxia (HCC)   Ventilator dependent (HCC)   Cavitary pneumonia   Bilateral pulmonary embolism (HCC)   Severe sepsis (HCC)   Lobar pneumonia, unspecified organism ([place])   1. Acute on chronic respiratory failure with hypoxia patient has been decannulated for 72 hours and is continuing to do well on room air. Continue supportive measures at this time. 2. Ventilator dependent resolved 3. Cavitary pneumonia improved 4. Bilateral pulmonary emboli stable continue supportive care 5. Severe sepsis hemodynamically stable 6. Lobar pneumonia clinically improving   I have personally seen and evaluated the patient, evaluated laboratory and imaging results, formulated the assessment and plan and placed orders. The Patient requires high complexity decision making for assessment and support.  Case was discussed on Rounds with the Respiratory Therapy Staff  Allyne Gee, MD [facility] Pulmonary Critical Care Medicine Sleep Medicine

## 2018-10-03 DIAGNOSIS — J181 Lobar pneumonia, unspecified organism: Secondary | ICD-10-CM | POA: Diagnosis not present

## 2018-10-03 DIAGNOSIS — J189 Pneumonia, unspecified organism: Secondary | ICD-10-CM | POA: Diagnosis not present

## 2018-10-03 DIAGNOSIS — I2699 Other pulmonary embolism without acute cor pulmonale: Secondary | ICD-10-CM | POA: Diagnosis not present

## 2018-10-03 DIAGNOSIS — J9621 Acute and chronic respiratory failure with hypoxia: Secondary | ICD-10-CM | POA: Diagnosis not present

## 2018-10-03 LAB — PROTIME-INR
INR: 2.1 — ABNORMAL HIGH (ref 0.8–1.2)
INR: 2.2 — ABNORMAL HIGH (ref 0.8–1.2)
Prothrombin Time: 23.5 seconds — ABNORMAL HIGH (ref 11.4–15.2)
Prothrombin Time: 23.8 seconds — ABNORMAL HIGH (ref 11.4–15.2)

## 2018-10-03 LAB — CBC
HCT: 30.2 % — ABNORMAL LOW (ref 36.0–46.0)
Hemoglobin: 9.7 g/dL — ABNORMAL LOW (ref 12.0–15.0)
MCH: 29.8 pg (ref 26.0–34.0)
MCHC: 32.1 g/dL (ref 30.0–36.0)
MCV: 92.6 fL (ref 80.0–100.0)
Platelets: 359 10*3/uL (ref 150–400)
RBC: 3.26 MIL/uL — ABNORMAL LOW (ref 3.87–5.11)
RDW: 14.4 % (ref 11.5–15.5)
WBC: 13.7 10*3/uL — ABNORMAL HIGH (ref 4.0–10.5)
nRBC: 0 % (ref 0.0–0.2)

## 2018-10-03 LAB — BASIC METABOLIC PANEL
Anion gap: 9 (ref 5–15)
BUN: 39 mg/dL — ABNORMAL HIGH (ref 8–23)
CO2: 26 mmol/L (ref 22–32)
Calcium: 9 mg/dL (ref 8.9–10.3)
Chloride: 102 mmol/L (ref 98–111)
Creatinine, Ser: 0.6 mg/dL (ref 0.44–1.00)
GFR calc Af Amer: >60 mL/min (ref >60)
GFR calc non Af Amer: >60 mL/min (ref >60)
Glucose, Bld: 139 mg/dL — ABNORMAL HIGH (ref 70–99)
Potassium: 4.2 mmol/L (ref 3.5–5.1)
Sodium: 137 mmol/L (ref 135–145)

## 2018-10-03 NOTE — Progress Notes (Addendum)
Pulmonary Critical Care Medicine [place]   PULMONARY CRITICAL CARE SERVICE  PROGRESS NOTE  Date of Service: 10/03/2018  Monica Finley  [id number]  DOB: [birth year]   DOA: 09/03/2018  Referring Physician: Merton Border, MD  HPI: Monica Finley is a 78 y.o. female seen for follow up of Acute on Chronic Respiratory Failure.  Patient is on room air at this time satting well with no fever or distress noted.  Medications: Reviewed on Rounds  Physical Exam:  Vitals: Pulse 89 respirations 19 BP 144/70 O2 sat 98% temp 97.9  Ventilator Settings room air  . General: Comfortable at this time . Eyes: Grossly normal lids, irises & conjunctiva . ENT: grossly tongue is normal . Neck: no obvious mass . Cardiovascular: S1 S2 normal no gallop . Respiratory: No rales or rhonchi noted . Abdomen: soft . Skin: no rash seen on limited exam . Musculoskeletal: not rigid . Psychiatric:unable to assess . Neurologic: no seizure no involuntary movements         Lab Data:   Basic Metabolic Panel: Recent Labs  Lab 09/27/18 0721 09/29/18 0524 10/03/18 0655  NA 133* 136 137  K 4.2 4.1 4.2  CL 98 99 102  CO2 27 28 26   GLUCOSE 136* 114* 139*  BUN 25* 30* 39*  CREATININE 0.64 0.64 0.60  CALCIUM 8.3* 8.6* 9.0    ABG: No results for input(s): PHART, PCO2ART, PO2ART, HCO3, O2SAT in the last 168 hours.  Liver Function Tests: No results for input(s): AST, ALT, ALKPHOS, BILITOT, PROT, ALBUMIN in the last 168 hours. No results for input(s): LIPASE, AMYLASE in the last 168 hours. No results for input(s): AMMONIA in the last 168 hours.  CBC: Recent Labs  Lab 09/26/18 1532 09/27/18 0721 09/29/18 0524 10/01/18 0944 10/03/18 0655  WBC  --  8.2 10.7* 10.6* 13.7*  HGB 8.8* 8.4* 8.8* 9.4* 9.7*  HCT 28.2* 26.4* 27.2* 29.0* 30.2*  MCV  --  95.0 92.5 93.5 92.6  PLT  --  314 322 317 359    Cardiac Enzymes: No results for input(s): CKTOTAL, CKMB, CKMBINDEX,  TROPONINI in the last 168 hours.  BNP (last 3 results) No results for input(s): BNP in the last 8760 hours.  ProBNP (last 3 results) No results for input(s): PROBNP in the last 8760 hours.  Radiological Exams: No results found.  Assessment/Plan Active Problems:   Acute on chronic respiratory failure with hypoxia (HCC)   Ventilator dependent (HCC)   Cavitary pneumonia   Bilateral pulmonary embolism (HCC)   Severe sepsis (HCC)   Lobar pneumonia, unspecified organism ([place])   1. Acute on chronic respiratory failure with hypoxia patient has been decannulated for96hours and is continuing to do well on room air. Continue supportive measures at this time. 2. Ventilator dependent resolved 3. Cavitary pneumonia improved 4. Bilateral pulmonary emboli stable continue supportive care 5. Severe sepsis hemodynamically stable 6. Lobar pneumonia clinically improving   I have personally seen and evaluated the patient, evaluated laboratory and imaging results, formulated the assessment and plan and placed orders. The Patient requires high complexity decision making for assessment and support.  Case was discussed on Rounds with the Respiratory Therapy Staff  Allyne Gee, MD [facility] Pulmonary Critical Care Medicine Sleep Medicine

## 2018-10-04 LAB — PROTIME-INR
INR: 3.3 — ABNORMAL HIGH (ref 0.8–1.2)
Prothrombin Time: 33.1 seconds — ABNORMAL HIGH (ref 11.4–15.2)

## 2018-10-05 LAB — PROTIME-INR
INR: 3.4 — ABNORMAL HIGH (ref 0.8–1.2)
Prothrombin Time: 33.4 seconds — ABNORMAL HIGH (ref 11.4–15.2)

## 2018-10-07 LAB — PROTIME-INR
INR: 2.1 — ABNORMAL HIGH (ref 0.8–1.2)
Prothrombin Time: 22.8 seconds — ABNORMAL HIGH (ref 11.4–15.2)

## 2018-10-08 ENCOUNTER — Other Ambulatory Visit (HOSPITAL_COMMUNITY): Payer: Medicare Other

## 2018-10-08 LAB — COMPREHENSIVE METABOLIC PANEL
ALT: 17 U/L (ref 0–44)
AST: 15 U/L (ref 15–41)
Albumin: 2.8 g/dL — ABNORMAL LOW (ref 3.5–5.0)
Alkaline Phosphatase: 100 U/L (ref 38–126)
Anion gap: 8 (ref 5–15)
BUN: 50 mg/dL — ABNORMAL HIGH (ref 8–23)
CO2: 28 mmol/L (ref 22–32)
Calcium: 9.1 mg/dL (ref 8.9–10.3)
Chloride: 103 mmol/L (ref 98–111)
Creatinine, Ser: 0.92 mg/dL (ref 0.44–1.00)
GFR calc Af Amer: >60 mL/min (ref >60)
GFR calc non Af Amer: 60 mL/min — ABNORMAL LOW (ref >60)
Glucose, Bld: 182 mg/dL — ABNORMAL HIGH (ref 70–99)
Potassium: 4.2 mmol/L (ref 3.5–5.1)
Sodium: 139 mmol/L (ref 135–145)
Total Bilirubin: 0.2 mg/dL — ABNORMAL LOW (ref 0.3–1.2)
Total Protein: 6.8 g/dL (ref 6.5–8.1)

## 2018-10-08 LAB — PROTIME-INR
INR: 1.6 — ABNORMAL HIGH (ref 0.8–1.2)
Prothrombin Time: 18.6 seconds — ABNORMAL HIGH (ref 11.4–15.2)

## 2018-10-09 LAB — PROTIME-INR
INR: 1.6 — ABNORMAL HIGH (ref 0.8–1.2)
Prothrombin Time: 19.1 s — ABNORMAL HIGH (ref 11.4–15.2)

## 2018-10-10 LAB — PROTIME-INR
INR: 3.2 — ABNORMAL HIGH (ref 0.8–1.2)
Prothrombin Time: 32.6 seconds — ABNORMAL HIGH (ref 11.4–15.2)

## 2018-10-11 ENCOUNTER — Other Ambulatory Visit (HOSPITAL_COMMUNITY): Payer: Medicare Other

## 2018-10-11 LAB — URINALYSIS, ROUTINE W REFLEX MICROSCOPIC
Bilirubin Urine: NEGATIVE
Glucose, UA: NEGATIVE mg/dL
Ketones, ur: NEGATIVE mg/dL
Nitrite: NEGATIVE
Protein, ur: >=300 mg/dL — AB
RBC / HPF: >50 RBC/hpf — ABNORMAL HIGH (ref 0–5)
Specific Gravity, Urine: 1.016 (ref 1.005–1.030)
WBC, UA: >50 WBC/hpf — ABNORMAL HIGH (ref 0–5)
pH: 8 (ref 5.0–8.0)

## 2018-10-11 LAB — CBC
HCT: 32 % — ABNORMAL LOW (ref 36.0–46.0)
Hemoglobin: 9.9 g/dL — ABNORMAL LOW (ref 12.0–15.0)
MCH: 28.8 pg (ref 26.0–34.0)
MCHC: 30.9 g/dL (ref 30.0–36.0)
MCV: 93 fL (ref 80.0–100.0)
Platelets: 345 10*3/uL (ref 150–400)
RBC: 3.44 MIL/uL — ABNORMAL LOW (ref 3.87–5.11)
RDW: 14.3 % (ref 11.5–15.5)
WBC: 14.1 10*3/uL — ABNORMAL HIGH (ref 4.0–10.5)
nRBC: 0 % (ref 0.0–0.2)

## 2018-10-11 LAB — NOVEL CORONAVIRUS, NAA (HOSP ORDER, SEND-OUT TO REF LAB; TAT 18-24 HRS): SARS-CoV-2, NAA: NOT DETECTED

## 2018-10-11 LAB — PROTIME-INR
INR: 3.8 — ABNORMAL HIGH (ref 0.8–1.2)
Prothrombin Time: 37.2 s — ABNORMAL HIGH (ref 11.4–15.2)

## 2018-10-12 ENCOUNTER — Other Ambulatory Visit (HOSPITAL_COMMUNITY): Payer: Medicare Other

## 2018-10-12 LAB — BASIC METABOLIC PANEL
Anion gap: 11 (ref 5–15)
BUN: 48 mg/dL — ABNORMAL HIGH (ref 8–23)
CO2: 25 mmol/L (ref 22–32)
Calcium: 9.2 mg/dL (ref 8.9–10.3)
Chloride: 104 mmol/L (ref 98–111)
Creatinine, Ser: 1.08 mg/dL — ABNORMAL HIGH (ref 0.44–1.00)
GFR calc Af Amer: 57 mL/min — ABNORMAL LOW (ref >60)
GFR calc non Af Amer: 49 mL/min — ABNORMAL LOW (ref >60)
Glucose, Bld: 89 mg/dL (ref 70–99)
Potassium: 3.7 mmol/L (ref 3.5–5.1)
Sodium: 140 mmol/L (ref 135–145)

## 2018-10-12 LAB — CBC
HCT: 33 % — ABNORMAL LOW (ref 36.0–46.0)
Hemoglobin: 10.4 g/dL — ABNORMAL LOW (ref 12.0–15.0)
MCH: 28.7 pg (ref 26.0–34.0)
MCHC: 31.5 g/dL (ref 30.0–36.0)
MCV: 91.2 fL (ref 80.0–100.0)
Platelets: 353 10*3/uL (ref 150–400)
RBC: 3.62 MIL/uL — ABNORMAL LOW (ref 3.87–5.11)
RDW: 14.2 % (ref 11.5–15.5)
WBC: 17.1 10*3/uL — ABNORMAL HIGH (ref 4.0–10.5)
nRBC: 0 % (ref 0.0–0.2)

## 2018-10-12 LAB — PROTIME-INR
INR: 3.4 — ABNORMAL HIGH (ref 0.8–1.2)
Prothrombin Time: 33.5 seconds — ABNORMAL HIGH (ref 11.4–15.2)

## 2018-10-12 LAB — PHOSPHORUS: Phosphorus: 3.1 mg/dL (ref 2.5–4.6)

## 2018-10-12 MED ORDER — IOHEXOL 300 MG/ML  SOLN
100.0000 mL | Freq: Once | INTRAMUSCULAR | Status: AC | PRN
  Administered 2018-10-12: 100 mL via INTRAVENOUS

## 2018-10-13 LAB — HEPATITIS B SURFACE ANTIGEN: Hepatitis B Surface Ag: NEGATIVE

## 2018-10-13 LAB — PROTIME-INR
INR: 2.7 — ABNORMAL HIGH (ref 0.8–1.2)
Prothrombin Time: 28.2 seconds — ABNORMAL HIGH (ref 11.4–15.2)

## 2018-10-13 LAB — URINE CULTURE: Culture: >=100000 — AB

## 2018-10-13 LAB — HEPATITIS B CORE ANTIBODY, TOTAL: Hep B Core Total Ab: NEGATIVE

## 2018-10-13 LAB — PTH, INTACT AND CALCIUM
Calcium, Total (PTH): 9.3 mg/dL (ref 8.7–10.3)
PTH: 19 pg/mL (ref 15–65)

## 2018-10-13 LAB — HEPATITIS B SURFACE ANTIBODY,QUALITATIVE: Hep B S Ab: NONREACTIVE

## 2018-10-14 LAB — PROTIME-INR
INR: 2.4 — ABNORMAL HIGH (ref 0.8–1.2)
Prothrombin Time: 25.7 seconds — ABNORMAL HIGH (ref 11.4–15.2)

## 2018-10-15 LAB — PROTIME-INR
INR: 3.7 — ABNORMAL HIGH (ref 0.8–1.2)
Prothrombin Time: 36.2 seconds — ABNORMAL HIGH (ref 11.4–15.2)

## 2018-10-16 LAB — PROTIME-INR
INR: 6.6 (ref 0.8–1.2)
INR: 4.7 (ref 0.8–1.2)
Prothrombin Time: 56.4 seconds — ABNORMAL HIGH (ref 11.4–15.2)
Prothrombin Time: 43.3 seconds — ABNORMAL HIGH (ref 11.4–15.2)

## 2018-10-17 LAB — BASIC METABOLIC PANEL
Anion gap: 9 (ref 5–15)
BUN: 41 mg/dL — ABNORMAL HIGH (ref 8–23)
CO2: 22 mmol/L (ref 22–32)
Calcium: 8.8 mg/dL — ABNORMAL LOW (ref 8.9–10.3)
Chloride: 106 mmol/L (ref 98–111)
Creatinine, Ser: 1.12 mg/dL — ABNORMAL HIGH (ref 0.44–1.00)
GFR calc Af Amer: 54 mL/min — ABNORMAL LOW (ref >60)
GFR calc non Af Amer: 47 mL/min — ABNORMAL LOW (ref >60)
Glucose, Bld: 98 mg/dL (ref 70–99)
Potassium: 4.6 mmol/L (ref 3.5–5.1)
Sodium: 137 mmol/L (ref 135–145)

## 2018-10-17 LAB — PROTIME-INR
INR: 3 — ABNORMAL HIGH (ref 0.8–1.2)
Prothrombin Time: 30.5 seconds — ABNORMAL HIGH (ref 11.4–15.2)

## 2018-10-17 LAB — CBC
HCT: 30.2 % — ABNORMAL LOW (ref 36.0–46.0)
Hemoglobin: 9.5 g/dL — ABNORMAL LOW (ref 12.0–15.0)
MCH: 28.8 pg (ref 26.0–34.0)
MCHC: 31.5 g/dL (ref 30.0–36.0)
MCV: 91.5 fL (ref 80.0–100.0)
Platelets: 250 10*3/uL (ref 150–400)
RBC: 3.3 MIL/uL — ABNORMAL LOW (ref 3.87–5.11)
RDW: 14.6 % (ref 11.5–15.5)
WBC: 16 10*3/uL — ABNORMAL HIGH (ref 4.0–10.5)
nRBC: 0 % (ref 0.0–0.2)

## 2018-10-18 LAB — CBC
HCT: 28.7 % — ABNORMAL LOW (ref 36.0–46.0)
Hemoglobin: 9.2 g/dL — ABNORMAL LOW (ref 12.0–15.0)
MCH: 29.3 pg (ref 26.0–34.0)
MCHC: 32.1 g/dL (ref 30.0–36.0)
MCV: 91.4 fL (ref 80.0–100.0)
Platelets: 246 10*3/uL (ref 150–400)
RBC: 3.14 MIL/uL — ABNORMAL LOW (ref 3.87–5.11)
RDW: 14.4 % (ref 11.5–15.5)
WBC: 15 10*3/uL — ABNORMAL HIGH (ref 4.0–10.5)
nRBC: 0 % (ref 0.0–0.2)

## 2018-10-18 LAB — PROTIME-INR
INR: 2.6 — ABNORMAL HIGH (ref 0.8–1.2)
Prothrombin Time: 27.8 seconds — ABNORMAL HIGH (ref 11.4–15.2)

## 2019-01-27 DEATH — deceased

## 2021-01-25 IMAGING — CT CT ABDOMEN WITHOUT CONTRAST
2 of 4 series · 16 of 46 positions shown, 18 images · non-contrast
Comparison: None.

CLINICAL DATA: Preop planning gastrostomy placement

EXAM:
CT ABDOMEN WITHOUT CONTRAST
TECHNIQUE: Multidetector CT imaging of the abdomen was performed following the
standard protocol without IV contrast.

[Series 3: a/p w/o 5mm · axial · non-contrast · 0.92mm/px · z∈[+1208,+1458]mm · 13 of 56 slices shown, 15 images]
[im 3/56  soft-tissue]
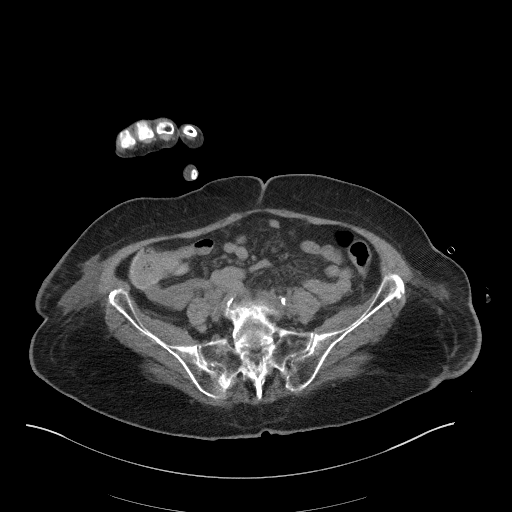
[im 3/56  bone]
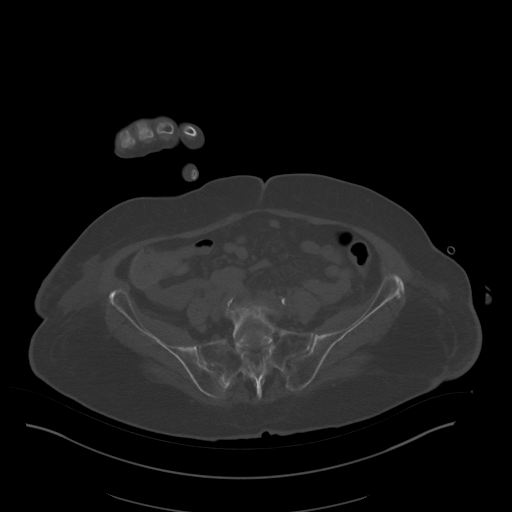
[im 8/56  soft-tissue]
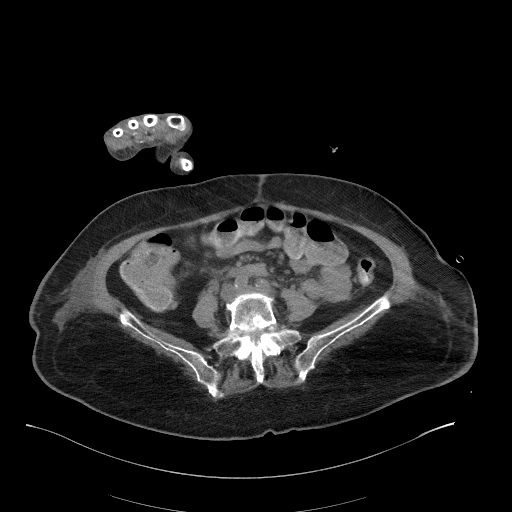
[im 12/56  soft-tissue]
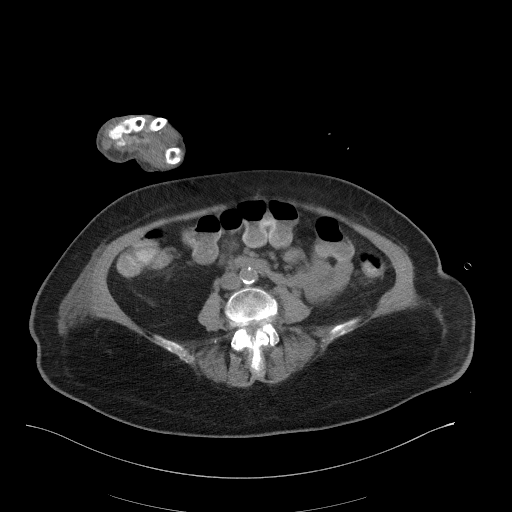
[im 15/56  soft-tissue]
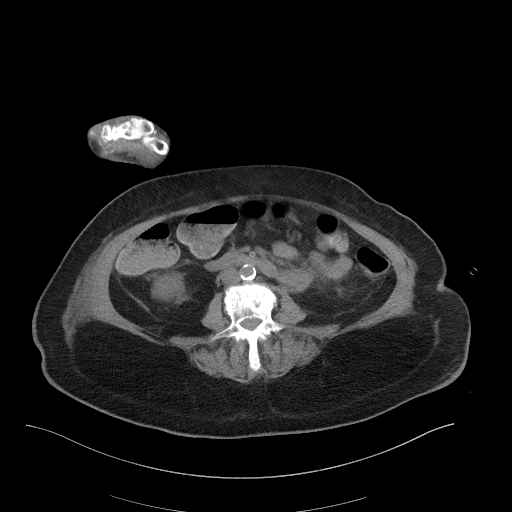
[im 20/56  soft-tissue]
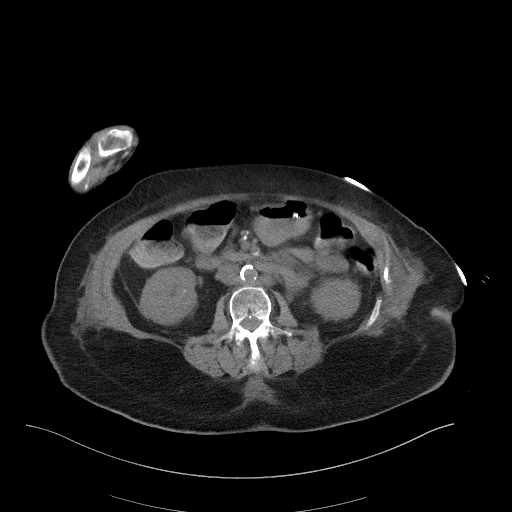
[im 24/56  soft-tissue]
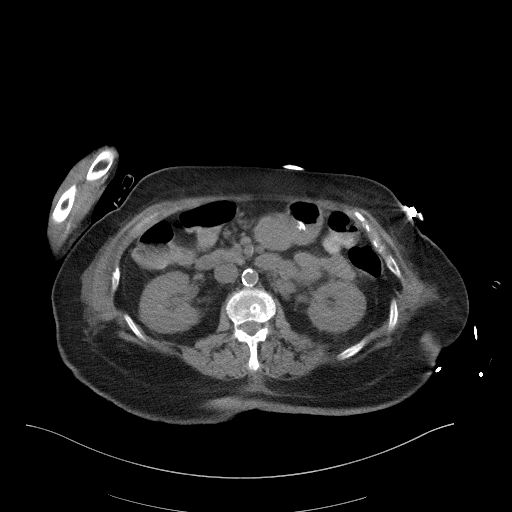
[im 29/56  soft-tissue]
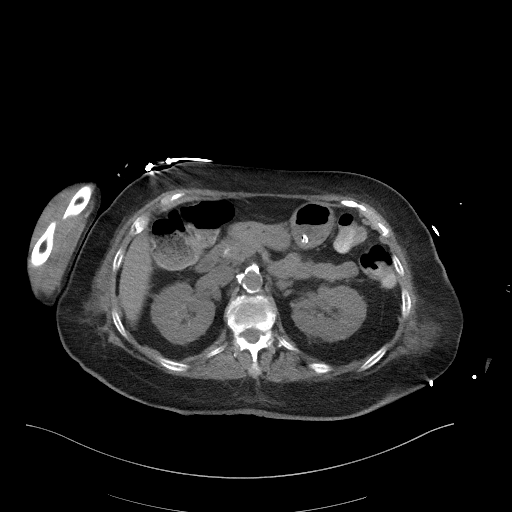
[im 32/56  soft-tissue]
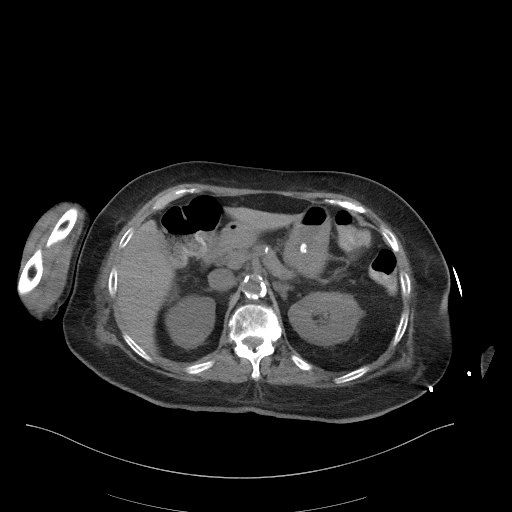
[im 36/56  soft-tissue]
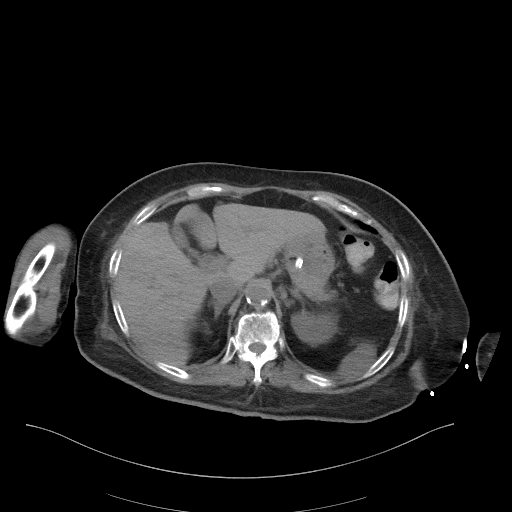
[im 36/56  bone]
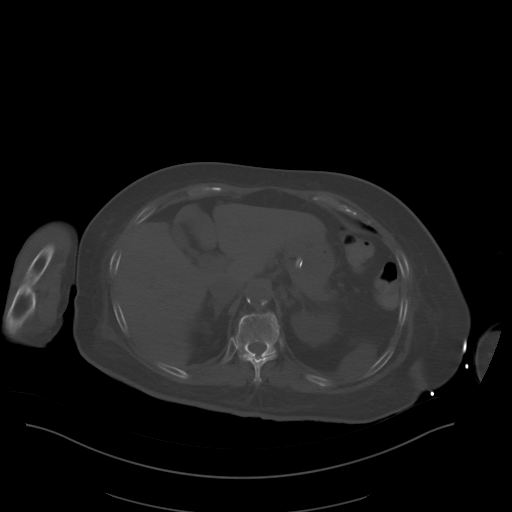
[im 41/56  soft-tissue]
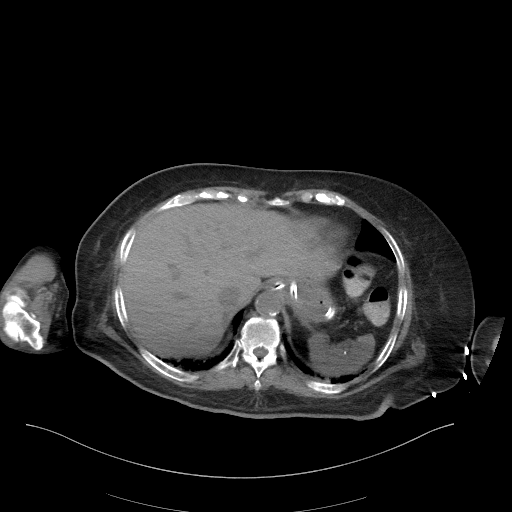
[im 44/56  soft-tissue]
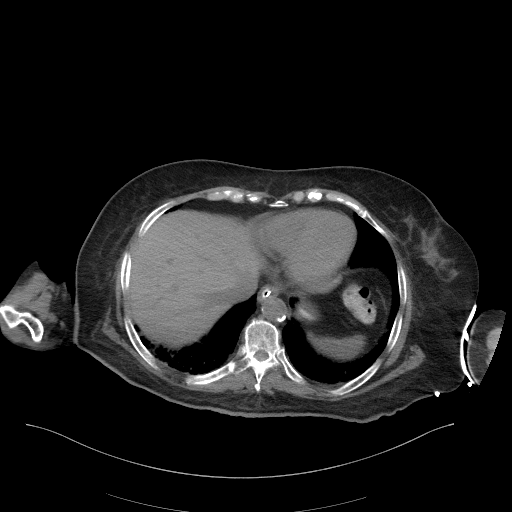
[im 48/56  soft-tissue]
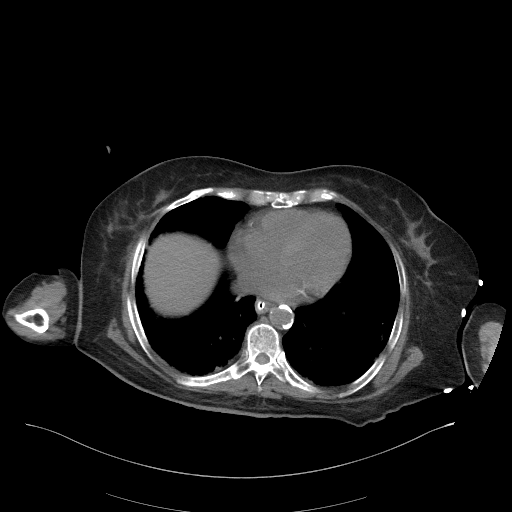
[im 53/56  soft-tissue]
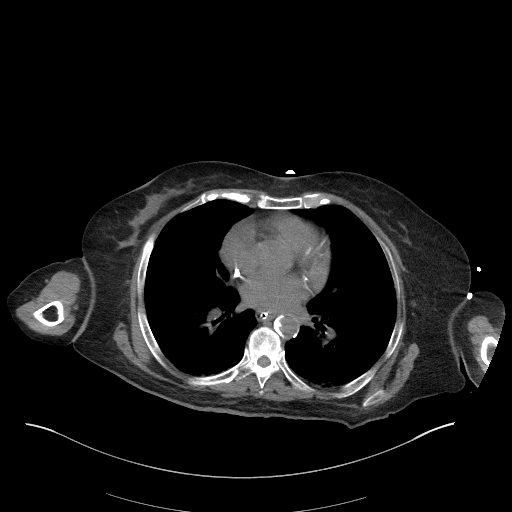

[Series 6: a/p w/o cor · coronal · non-contrast · 0.54mm/px · 3 of 151 slices shown]
[im 51/151  soft-tissue]
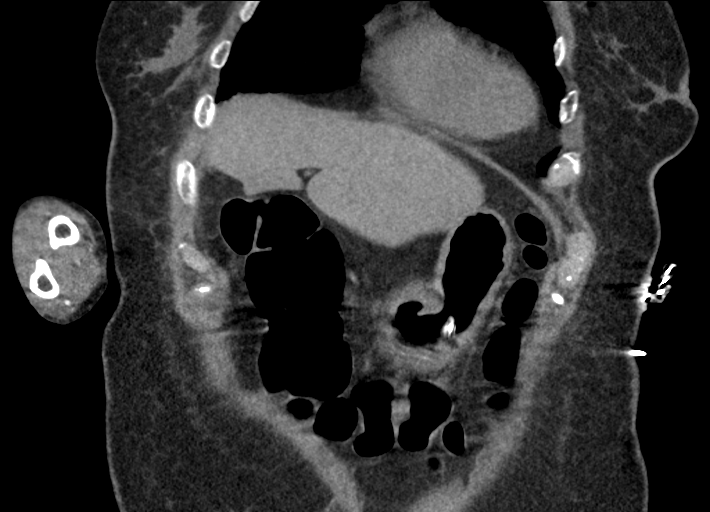
[im 67/151  soft-tissue]
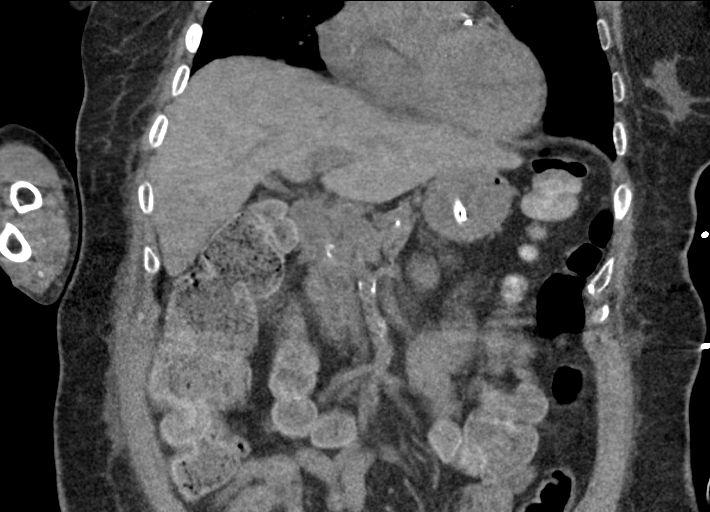
[im 84/151  soft-tissue]
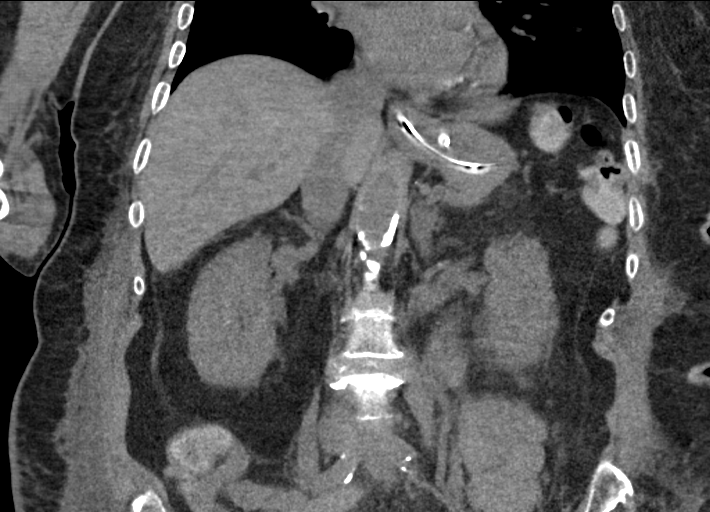

[16 of 46 positions shown; findings below may reference images not displayed]

FINDINGS: Lower chest: No pleural or pericardial effusion. Aortic and coronary
calcifications. Patchy nodular peripheral opacities in the lung
bases left greater than right.

Hepatobiliary: No focal liver abnormality is seen. No biliary
dilatation. Gallbladder is collapsed or absent.

Pancreas: Unremarkable. No pancreatic ductal dilatation or
surrounding inflammatory changes.

Spleen: Normal in size without focal abnormality.

Adrenals/Urinary Tract: Bilateral nephrolithiasis, largest stone or
cluster 5 mm in the left upper pole. No hydronephrosis. Unremarkable
adrenal glands.

Stomach/Bowel: Nasogastric tube loops in the stomach which is
nondistended. Safe window for percutaneous gastrostomy is
identified. Visualized portions of small bowel and colon are
nondilated, unremarkable.

Vascular/Lymphatic: Heavy aortic atheromatous calcifications without
aneurysm. Retroaortic left renal vein, an anatomic variant.

Other: No ascites.  No free air.

Musculoskeletal: Mild wedge deformity of T12, age indeterminate.
Spondylitic changes in the visualized lower thoracic and lumbar
spine. No worrisome bone lesion.
IMPRESSION: 1. Safe percutaneous approach for gastrostomy is identified, without
complicating factors.
2. Coronary and Aortic Atherosclerosis ([id number]-170.0).
3. Bilateral nephrolithiasis without hydronephrosis.

## 2021-01-26 IMAGING — DX PORTABLE CHEST - 1 VIEW
1 series · 1 of 1 positions shown · non-contrast
Comparison: Single-view of the chest 09/03/2018.

CLINICAL DATA: Intubated patient admitted with pneumonia.

EXAM:
PORTABLE CHEST 1 VIEW

[chest]
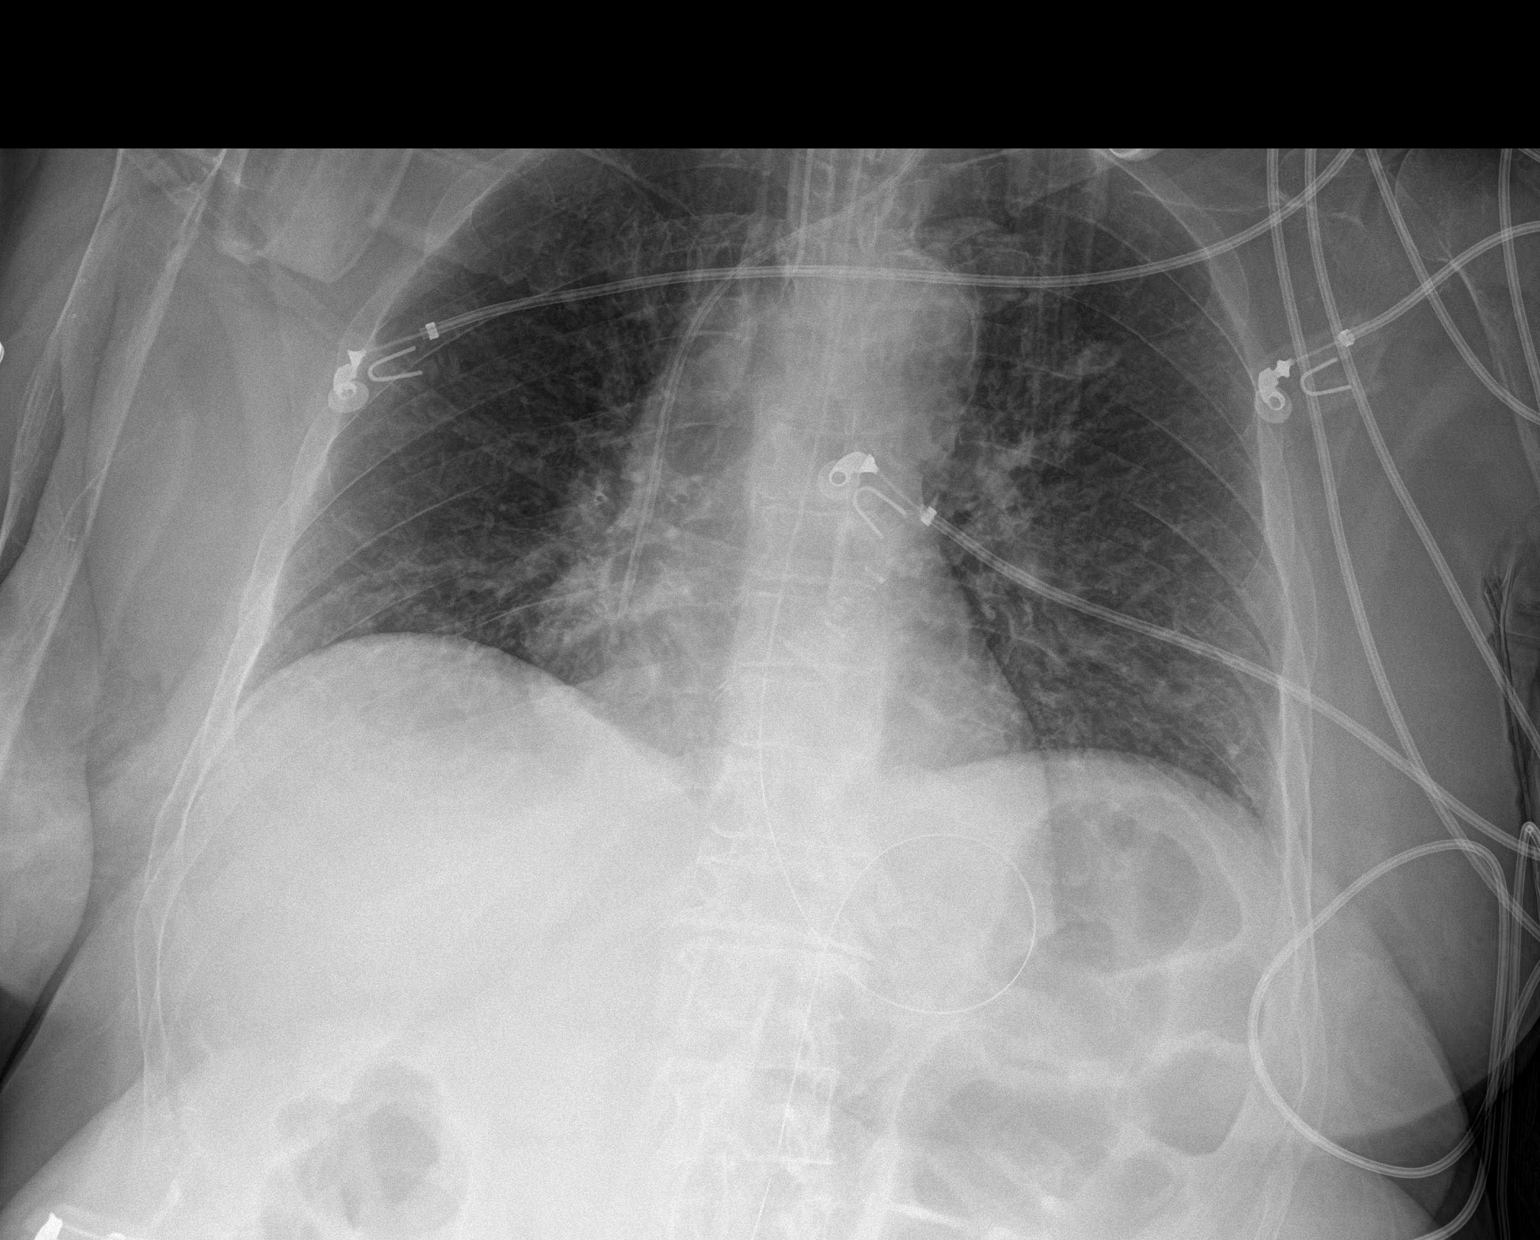

[1 of 1 positions shown; findings below may reference images not displayed]

FINDINGS: Endotracheal tube is in good position with the tip just below the
clavicular heads. Left IJ approach central venous catheter tip is at
the superior cavoatrial junction, unchanged. NG tube tip is just
below the inferior margin of film within the stomach.

The lungs are clear. No pneumothorax or pleural effusion. Heart size
is normal. Aortic atherosclerosis noted. No acute bony abnormality.
IMPRESSION: Support tubes and lines projecting good position.

Lungs are clear.
